# Patient Record
Sex: Male | Born: 1980
Health system: Southern US, Community
[De-identification: ages and names within clinical notes are randomized; demographics above are authoritative.]

## PROBLEM LIST (undated history)

## (undated) DIAGNOSIS — F329 Major depressive disorder, single episode, unspecified: Secondary | ICD-10-CM

## (undated) DIAGNOSIS — Z8781 Personal history of (healed) traumatic fracture: Secondary | ICD-10-CM

## (undated) DIAGNOSIS — K8020 Calculus of gallbladder without cholecystitis without obstruction: Secondary | ICD-10-CM

## (undated) DIAGNOSIS — F32A Depression, unspecified: Secondary | ICD-10-CM

## (undated) HISTORY — DX: Personal history of (healed) traumatic fracture: Z87.81

## (undated) HISTORY — DX: Depression, unspecified: F32.A

## (undated) HISTORY — DX: Calculus of gallbladder without cholecystitis without obstruction: K80.20

## (undated) HISTORY — PX: APPENDECTOMY: SHX54

## (undated) HISTORY — PX: REFRACTIVE SURGERY: SHX103

---
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
## 2008-07-21 DIAGNOSIS — Z8781 Personal history of (healed) traumatic fracture: Secondary | ICD-10-CM

## 2008-07-21 HISTORY — DX: Personal history of (healed) traumatic fracture: Z87.81

## 2008-07-21 HISTORY — PX: CHOLECYSTECTOMY: SHX55

## 2018-03-17 ENCOUNTER — Ambulatory Visit: Payer: 59 | Admitting: Family Medicine

## 2018-03-17 ENCOUNTER — Encounter: Payer: Self-pay | Admitting: Family Medicine

## 2018-03-17 ENCOUNTER — Other Ambulatory Visit: Payer: Self-pay

## 2018-03-17 VITALS — BP 128/84 | HR 65 | Temp 98.0°F | Wt 186.0 lb

## 2018-03-17 DIAGNOSIS — F4329 Adjustment disorder with other symptoms: Secondary | ICD-10-CM

## 2018-03-17 DIAGNOSIS — F321 Major depressive disorder, single episode, moderate: Secondary | ICD-10-CM | POA: Diagnosis not present

## 2018-03-17 HISTORY — DX: Adjustment disorder with other symptoms: F43.29

## 2018-03-17 MED ORDER — CITALOPRAM HYDROBROMIDE 10 MG PO TABS: 10.0000 mg | ORAL_TABLET | Freq: Every day | ORAL | 2 refills | Status: DC

## 2018-03-17 NOTE — Patient Instructions (Signed)
Living With Depression Everyone experiences occasional disappointment, sadness, and loss in their lives. When you are feeling down, blue, or sad for at least 2 weeks in a row, it may mean that you have depression. Depression can affect your thoughts and feelings, relationships, daily activities, and physical health. It is caused by changes in the way your brain functions. If you receive a diagnosis of depression, your health care provider will tell you which type of depression you have and what treatment options are available to you. If you are living with depression, there are ways to help you recover from it and also ways to prevent it from coming back. How to cope with lifestyle changes Coping with stress Stress is your body's reaction to life changes and events, both good and bad. Stressful situations may include:  Getting married.  The death of a spouse.  Losing a job.  Retiring.  Having a baby.  Stress can last just a few hours or it can be ongoing. Stress can play a major role in depression, so it is important to learn both how to cope with stress and how to think about it differently. Talk with your health care provider or a counselor if you would like to learn more about stress reduction. He or she may suggest some stress reduction techniques, such as:  Music therapy. This can include creating music or listening to music. Choose music that you enjoy and that inspires you.  Mindfulness-based meditation. This kind of meditation can be done while sitting or walking. It involves being aware of your normal breaths, rather than trying to control your breathing.  Centering prayer. This is a kind of meditation that involves focusing on a spiritual word or phrase. Choose a word, phrase, or sacred image that is meaningful to you and that brings you peace.  Deep breathing. To do this, expand your stomach and inhale slowly through your nose. Hold your breath for 3-5 seconds, then exhale  slowly, allowing your stomach muscles to relax.  Muscle relaxation. This involves intentionally tensing muscles then relaxing them.  Choose a stress reduction technique that fits your lifestyle and personality. Stress reduction techniques take time and practice to develop. Set aside 5-15 minutes a day to do them. Therapists can offer training in these techniques. The training may be covered by some insurance plans. Other things you can do to manage stress include:  Keeping a stress diary. This can help you learn what triggers your stress and ways to control your response.  Understanding what your limits are and saying no to requests or events that lead to a schedule that is too full.  Thinking about how you respond to certain situations. You may not be able to control everything, but you can control how you react.  Adding humor to your life by watching funny films or TV shows.  Making time for activities that help you relax and not feeling guilty about spending your time this way.  Medicines Your health care provider may suggest certain medicines if he or she feels that they will help improve your condition. Avoid using alcohol and other substances that may prevent your medicines from working properly (may interact). It is also important to:  Talk with your pharmacist or health care provider about all the medicines that you take, their possible side effects, and what medicines are safe to take together.  Make it your goal to take part in all treatment decisions (shared decision-making). This includes giving input on the side   effects of medicines. It is best if shared decision-making with your health care provider is part of your total treatment plan.  If your health care provider prescribes a medicine, you may not notice the full benefits of it for 4-8 weeks. Most people who are treated for depression need to be on medicine for at least 6-12 months after they feel better. If you are taking  medicines as part of your treatment, do not stop taking medicines without first talking to your health care provider. You may need to have the medicine slowly decreased (tapered) over time to decrease the risk of harmful side effects. Relationships Your health care provider may suggest family therapy along with individual therapy and drug therapy. While there may not be family problems that are causing you to feel depressed, it is still important to make sure your family learns as much as they can about your mental health. Having your family's support can help make your treatment successful. How to recognize changes in your condition Everyone has a different response to treatment for depression. Recovery from major depression happens when you have not had signs of major depression for two months. This may mean that you will start to:  Have more interest in doing activities.  Feel less hopeless than you did 2 months ago.  Have more energy.  Overeat less often, or have better or improving appetite.  Have better concentration.  Your health care provider will work with you to decide the next steps in your recovery. It is also important to recognize when your condition is getting worse. Watch for these signs:  Having fatigue or low energy.  Eating too much or too little.  Sleeping too much or too little.  Feeling restless, agitated, or hopeless.  Having trouble concentrating or making decisions.  Having unexplained physical complaints.  Feeling irritable, angry, or aggressive.  Get help as soon as you or your family members notice these symptoms coming back. How to get support and help from others How to talk with friends and family members about your condition Talking to friends and family members about your condition can provide you with one way to get support and guidance. Reach out to trusted friends or family members, explain your symptoms to them, and let them know that you are  working with a health care provider to treat your depression. Financial resources Not all insurance plans cover mental health care, so it is important to check with your insurance carrier. If paying for co-pays or counseling services is a problem, search for a local or county mental health care center. They may be able to offer public mental health care services at low or no cost when you are not able to see a private health care provider. If you are taking medicine for depression, you may be able to get the generic form, which may be less expensive. Some makers of prescription medicines also offer help to patients who cannot afford the medicines they need. Follow these instructions at home:  Get the right amount and quality of sleep.  Cut down on using caffeine, tobacco, alcohol, and other potentially harmful substances.  Try to exercise, such as walking or lifting small weights.  Take over-the-counter and prescription medicines only as told by your health care provider.  Eat a healthy diet that includes plenty of vegetables, fruits, whole grains, low-fat dairy products, and lean protein. Do not eat a lot of foods that are high in solid fats, added sugars, or salt.    Keep all follow-up visits as told by your health care provider. This is important. Contact a health care provider if:  You stop taking your antidepressant medicines, and you have any of these symptoms: ? Nausea. ? Headache. ? Feeling lightheaded. ? Chills and body aches. ? Not being able to sleep (insomnia).  You or your friends and family think your depression is getting worse. Get help right away if:  You have thoughts of hurting yourself or others. If you ever feel like you may hurt yourself or others, or have thoughts about taking your own life, get help right away. You can go to your nearest emergency department or call:  Your local emergency services (911 in the [place]  A suicide crisis helpline, such as the  National Suicide Prevention Lifeline at 1-800-273-8255. This is open 24-hours a day.  Summary  If you are living with depression, there are ways to help you recover from it and also ways to prevent it from coming back.  Work with your health care team to create a management plan that includes counseling, stress management techniques, and healthy lifestyle habits. This information is not intended to replace advice given to you by your health care provider. Make sure you discuss any questions you have with your health care provider. Document Released: 06/09/2016 Document Revised: 06/09/2016 Document Reviewed: 06/09/2016 Elsevier Interactive Patient Education  2018 [employer] Inc[employer]  

## 2018-03-17 NOTE — Progress Notes (Signed)
Subjective:    Patient ID: Brandon Murray, male    DOB: 1980-08-15, 37 y.o.   MRN: [id number]  HPI   Patient presents to clinic to establish care with primary care provider.  Past medical history, family history, surgical history, social history reviewed and updated in chart.  Patient's main concern is depression.  Patient has gone through a lot of stressful changes in his life recently.  He got married in May, but wife told him she wanted a divorce a few weeks after the wedding.  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  He went to his Investment banker, operational who told him to take off as much time as he needed, and helped him get set up with a counselor.  Patient feels the counseling sessions have been helpful, but is interested in starting a medication to better control depression symptoms.    Patient also is concerned with where he will live, states he and wife built a house in [place] which she is currently living in, but he moved out and had to get a new place quickly.  Patient has no children of his own, but did have 2 stepchildren with wife.  States he has known the children since they were very young around age 35, and they are currently elementary school age -not seeing the children has been a hard change.  Patient has been trying to keep himself busy with exercise and spending time with his parents.   Patient Active Problem List   Diagnosis Date Noted  . Depression, major, single episode, moderate ([place]) 03/17/2018  . Stress and adjustment reaction 03/17/2018   Social History   Tobacco Use  . Smoking status: Never Smoker  . Smokeless tobacco: Never Used  Substance Use Topics  . Alcohol use: Yes    Alcohol/week: 1.0 - 2.0 standard drinks    Types: 1 - 2 Cans of beer per week    Comment: drinks 1-2 beers  weekly   Family History  Problem Relation Age of Onset  . Diabetes Father   . Diabetes Maternal Grandmother   . Cancer Maternal Grandfather   . Diabetes Maternal Grandfather   . Heart disease Paternal Grandmother    Past Surgical History:  Procedure Laterality Date  . CHOLECYSTECTOMY  2010   Past Medical History:  Diagnosis Date  . History of fractured pelvis 2010    Review of Systems  Constitutional: Negative for chills, fatigue and fever.  HENT: Negative for congestion, ear pain, sinus pain and sore throat.   Eyes: Negative.   Respiratory: Negative for cough, shortness of breath and wheezing.   Cardiovascular: Negative for chest pain, palpitations and leg swelling.  Gastrointestinal: Negative for abdominal pain, diarrhea, nausea and vomiting.  Genitourinary: Negative for dysuria, frequency and urgency.  Musculoskeletal: Negative for arthralgias and myalgias.  Skin: Negative for color change, pallor and rash.  Neurological: Negative for syncope, light-headedness and headaches.  Psychiatric/Behavioral: Increased stress, anxiety, depression     Objective:   Physical Exam  Constitutional: He is oriented to person, place, and time. He appears well-developed and well-nourished.  HENT:  Head: Normocephalic and atraumatic.  Eyes: No scleral icterus.  Cardiovascular: Normal rate.  Pulmonary/Chest: Effort normal. No respiratory distress.  Neurological: He is alert and oriented to person, place, and time.  Gait normal  Skin: Skin is warm and dry. He is not diaphoretic. No pallor.  Psychiatric: His behavior is normal. Judgment and thought content normal.  Patient seems sad, down. Denies any SI - states he did have one moment with a thought of what would life be like if he was dead when he and wife first split up, but he went straight to his director and go set up with counseling.  Nursing note and vitals reviewed.     Vitals:   03/17/18 0822  BP: 128/84  Pulse: 65  Temp:  98 F (36.7 C)  SpO2: 98%   PHQ 9 score: 9  Assessment & Plan:    Depression - patient will begin Celexa 10 mg once daily.  He will continue counseling sessions, has been going every 1 to 2 weeks.  Also enjoys working out and will continue to do this to keep her mind busy.  Has been trying to spend time with family as well.   Increased stress - patient has had a lot of extreme changes in his life occur all at the same time.  He is taking some time from work to help manage all this, but plans to get back into work soon.  Different strategies discussed to help manage stress including exercise, reading inspirational quotes and books, listening to motivational speakers.   He will follow-up in approximately 3 weeks for recheck on mood after starting the new medication.

## 2018-04-07 ENCOUNTER — Ambulatory Visit: Payer: 59 | Admitting: Family Medicine

## 2018-04-07 DIAGNOSIS — Z0289 Encounter for other administrative examinations: Secondary | ICD-10-CM

## 2018-04-19 ENCOUNTER — Encounter: Payer: Self-pay | Admitting: Family Medicine

## 2018-04-19 ENCOUNTER — Ambulatory Visit: Payer: 59 | Admitting: Family Medicine

## 2018-04-19 DIAGNOSIS — F321 Major depressive disorder, single episode, moderate: Secondary | ICD-10-CM | POA: Diagnosis not present

## 2018-04-19 MED ORDER — CITALOPRAM HYDROBROMIDE 10 MG PO TABS: 10.0000 mg | ORAL_TABLET | Freq: Every day | ORAL | 1 refills | Status: DC

## 2018-04-19 NOTE — Progress Notes (Signed)
   Subjective:    Patient ID: Brandon Murray, male    DOB: 10-Oct-1980, 37 y.o.   MRN: [id number]  HPI   Patient presents to clinic to follow-up on anxiety after starting Celexa.  States his anxiety is very much improved.  He continues to see counseling at least 1 time every 1 to 2 weeks.  States he feels that the combination of medication and counseling has helped improve his anxiety greatly.  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  States that shifts are demanding, but overall feels he is handling it well.  Denies any suicidal ideation.  Patient Active Problem List   Diagnosis Date Noted  . Depression, major, single episode, moderate ([place]) 03/17/2018  . Stress and adjustment reaction 03/17/2018   Social History   Tobacco Use  . Smoking status: Never Smoker  . Smokeless tobacco: Never Used  Substance Use Topics  . Alcohol use: Yes    Alcohol/week: 1.0 - 2.0 standard drinks    Types: 1 - 2 Cans of beer per week    Comment: drinks 1-2 beers weekly    Review of Systems  Constitutional: Negative for chills, fatigue and fever.  HENT: Negative for congestion, ear pain, sinus pain and sore throat.   Eyes: Negative.   Respiratory: Negative for cough, shortness of breath and wheezing.   Cardiovascular: Negative for chest pain, palpitations and leg swelling.  Gastrointestinal: Negative for abdominal pain, diarrhea, nausea and vomiting.  Genitourinary: Negative for dysuria, frequency and urgency.  Musculoskeletal: Negative for arthralgias and myalgias.  Skin: Negative for color change, pallor and rash.  Neurological: Negative for syncope, light-headedness and headaches.  Psychiatric/Behavioral: The patient is not nervous/anxious.       Objective:   Physical Exam  Constitutional: He is oriented to person, place, and time. He appears well-developed and well-nourished. No distress.  HENT:  Head: Normocephalic and  atraumatic.  Cardiovascular: Normal rate.  Pulmonary/Chest: Effort normal. No respiratory distress.  Neurological: He is alert and oriented to person, place, and time.  Skin: Skin is warm and dry. No pallor.  Psychiatric: He has a normal mood and affect. His behavior is normal. Judgment and thought content normal.  No SI  Nursing note and vitals reviewed.     Vitals:   04/19/18 0842  BP: 122/82  Pulse: 60  Temp: 97.9 F (36.6 C)  SpO2: 97%    Assessment & Plan:    Anxiety - patient will continue Celexa 10 mg once daily.  He also plans to continue seeing his counselor at least once every 2 weeks as this is been very helpful to him.   Patient gets flu vaccine at employee clinic  Patient will follow-up in approximately 3 months for recheck on how he is doing.  Patient advised he can return to clinic sooner at any time if issues arise.

## 2018-07-06 DIAGNOSIS — H5213 Myopia, bilateral: Secondary | ICD-10-CM | POA: Diagnosis not present

## 2018-07-06 DIAGNOSIS — H52223 Regular astigmatism, bilateral: Secondary | ICD-10-CM | POA: Diagnosis not present

## 2018-07-22 ENCOUNTER — Ambulatory Visit: Payer: 59 | Admitting: Family Medicine

## 2018-07-22 ENCOUNTER — Encounter: Payer: Self-pay | Admitting: Family Medicine

## 2018-07-22 VITALS — BP 120/76 | HR 76 | Temp 98.4°F | Ht 69.0 in | Wt 198.0 lb

## 2018-07-22 DIAGNOSIS — R102 Pelvic and perineal pain: Secondary | ICD-10-CM

## 2018-07-22 DIAGNOSIS — F4329 Adjustment disorder with other symptoms: Secondary | ICD-10-CM | POA: Diagnosis not present

## 2018-07-22 DIAGNOSIS — F321 Major depressive disorder, single episode, moderate: Secondary | ICD-10-CM

## 2018-07-22 LAB — CBC WITH DIFFERENTIAL/PLATELET
BASOS PCT: 0.9 % (ref 0.0–3.0)
Basophils Absolute: 0.1 10*3/uL (ref 0.0–0.1)
EOS PCT: 1.7 % (ref 0.0–5.0)
Eosinophils Absolute: 0.1 10*3/uL (ref 0.0–0.7)
HCT: 45.1 % (ref 39.0–52.0)
HEMOGLOBIN: 15.2 g/dL (ref 13.0–17.0)
Lymphocytes Relative: 23.1 % (ref 12.0–46.0)
Lymphs Abs: 1.8 10*3/uL (ref 0.7–4.0)
MCHC: 33.7 g/dL (ref 30.0–36.0)
MCV: 84.3 fl (ref 78.0–100.0)
MONO ABS: 0.7 10*3/uL (ref 0.1–1.0)
MONOS PCT: 9.6 % (ref 3.0–12.0)
NEUTROS PCT: 64.7 % (ref 43.0–77.0)
Neutro Abs: 5 10*3/uL (ref 1.4–7.7)
Platelets: 325 10*3/uL (ref 150.0–400.0)
RBC: 5.35 Mil/uL (ref 4.22–5.81)
RDW: 12.9 % (ref 11.5–15.5)
WBC: 7.8 10*3/uL (ref 4.0–10.5)

## 2018-07-22 LAB — URINALYSIS, ROUTINE W REFLEX MICROSCOPIC
BILIRUBIN URINE: NEGATIVE
Ketones, ur: NEGATIVE
LEUKOCYTES UA: NEGATIVE
Nitrite: NEGATIVE
PH: 6 (ref 5.0–8.0)
SPECIFIC GRAVITY, URINE: 1.015 (ref 1.000–1.030)
TOTAL PROTEIN, URINE-UPE24: NEGATIVE
Urine Glucose: NEGATIVE
Urobilinogen, UA: 0.2 (ref 0.0–1.0)

## 2018-07-22 LAB — BASIC METABOLIC PANEL
BUN: 10 mg/dL (ref 6–23)
CALCIUM: 9.3 mg/dL (ref 8.4–10.5)
CO2: 26 meq/L (ref 19–32)
CREATININE: 1.11 mg/dL (ref 0.40–1.50)
Chloride: 101 mEq/L (ref 96–112)
GFR: 79.82 mL/min (ref >60.00)
Glucose, Bld: 75 mg/dL (ref 70–99)
Potassium: 4.1 mEq/L (ref 3.5–5.1)
SODIUM: 135 meq/L (ref 135–145)

## 2018-07-22 NOTE — Progress Notes (Signed)
Subjective:    Patient ID: Brandon Murray, male    DOB: [birth year], 38 y.o.   MRN: [id number]  HPI   Presents to clinic for 3 month follow up on mood with being on celexa.  Overall he is feeling well. He spent the holidays with his parents.  States he was at first worried that the holidays would be tough due to he is no longer with his wife and stepchildren, but he actually enjoyed himself over the Christmas holiday.  States he still thinks about his ex-wife and stepchildren, but he no longer feels an overwhelming sadness.  Patient states he is able to function well and does not feel his emotions are running his life.  He also enjoys his work very much, feels it keeps him occupied and he feels he has a purpose because he is able to help others.  Denies any SI or HI. Sees counselor every 2-4 weeks.   Patient also does report a suprapubic tenderness, notices it off and on.  Denies any pain with urination, denies any possibility of STD.  Bowel movements are normal.  States he did increase his caffeine intake over the past month due to having to work the night shift.  No nausea or vomiting.  Appetite is normal, tries to keep himself hydrated.  States the pain improves after he empties his bladder.  Patient Active Problem List   Diagnosis Date Noted  . Depression, major, single episode, moderate ([place]) 03/17/2018  . Stress and adjustment reaction 03/17/2018   Social History   Tobacco Use  . Smoking status: Never Smoker  . Smokeless tobacco: Never Used  Substance Use Topics  . Alcohol use: Yes    Alcohol/week: 1.0 - 2.0 standard drinks    Types: 1 - 2 Cans of beer per week    Comment: drinks 1-2 beers weekly   Review of Systems   Constitutional: Negative for chills, fatigue and fever.  HENT: Negative for congestion, ear pain, sinus pain and sore throat.   Eyes: Negative.   Respiratory: Negative for cough, shortness of breath and wheezing.   Cardiovascular: Negative for chest pain,  palpitations and leg swelling.  Gastrointestinal: Negative for abdominal pain, diarrhea, nausea and vomiting.  Genitourinary: Negative for dysuria, frequency and urgency. Some suprapubic pain, better after empties bladder Musculoskeletal: Negative for arthralgias and myalgias.  Skin: Negative for color change, pallor and rash.  Neurological: Negative for syncope, light-headedness and headaches.  Psychiatric/Behavioral: The patient is not nervous/anxious.       Objective:   Physical Exam   Constitutional: He appears well-developed and well-nourished. No distress.  HENT:  Head: Normocephalic and atraumatic.  Eyes: Pupils are equal, round, and reactive to light. Conjunctivae and EOM are normal. No scleral icterus.  Neck: Normal range of motion. Neck supple. No tracheal deviation present.  Cardiovascular: Normal rate, regular rhythm and normal heart sounds.  Pulmonary/Chest: Effort normal and breath sounds normal. No respiratory distress. He has no wheezes. He has no rales.  Abdominal: Soft. Bowel sounds are normal.  Positive mild left-sided suprapubic tenderness. Neurological: He is alert and oriented to person, place, and time.  Gait normal  Skin: Skin is warm and dry. He is not diaphoretic. No pallor.  Psychiatric: He has a normal mood and affect. His behavior is normal. Thought content normal.   Nursing note and vitals reviewed.  Vitals:   07/22/18 0816  BP: 120/76  Pulse: 76  Temp: 98.4 F (36.9 C)  SpO2: 95%  Assessment & Plan:   Depression, stress and adjustment reaction - patient's mood is stable on Celexa 10 mg.  He will continue this medication.  He has good support from his family and enjoys his work very much.  He will continue to utilize counselor as he has been doing.  Suprapubic pain - tenderness is mild.  We will check urine for any UTI, and we will also get CBC and BMP in clinic today.  Patient encouraged to avoid excess caffeine and sugary beverages and try  to increase water intake.  If pain persists and urine is unremarkable for any abnormality, we can consider ultrasound imaging at that time.  Patient will follow-up here in approximately 3 months for recheck on mood, he is aware he return to clinic sooner if any issues arise.

## 2018-07-23 LAB — URINE CULTURE
MICRO NUMBER: 2930
Result:: NO GROWTH
SPECIMEN QUALITY:: ADEQUATE

## 2018-07-26 ENCOUNTER — Telehealth: Payer: Self-pay | Admitting: Family Medicine

## 2018-07-26 NOTE — Telephone Encounter (Signed)
Copied from [place] [id number]. Topic: Quick Communication - Lab Results (Clinic Use ONLY) >> Jul 26, 2018  9:55 AM Neta Ehlers, RMA wrote: Called patient to inform them of 07/23/2018 lab results. When patient returns call, triage nurse may disclose results. >> Jul 26, 2018  4:17 PM Bea Graff, NT wrote: Pt returning call to receive lab results. May leave detailed message per pt.

## 2018-08-24 ENCOUNTER — Telehealth: Payer: Self-pay | Admitting: Radiology

## 2018-08-24 DIAGNOSIS — R319 Hematuria, unspecified: Secondary | ICD-10-CM

## 2018-08-24 NOTE — Telephone Encounter (Signed)
Urine lab order in

## 2018-08-24 NOTE — Telephone Encounter (Signed)
Pt coming in for labs tomorrow, please place future orders. Thank you.  

## 2018-08-25 ENCOUNTER — Other Ambulatory Visit: Payer: 59

## 2018-10-08 MED FILL — CITALOPRAM HBR 10 MG TABLET: 10 | 90 days supply | Qty: 90 | Fill #0

## 2018-10-11 ENCOUNTER — Ambulatory Visit (INDEPENDENT_AMBULATORY_CARE_PROVIDER_SITE_OTHER): Payer: 59 | Admitting: Family Medicine

## 2018-10-11 ENCOUNTER — Other Ambulatory Visit: Payer: Self-pay

## 2018-10-11 ENCOUNTER — Encounter: Payer: Self-pay | Admitting: Family Medicine

## 2018-10-11 DIAGNOSIS — R319 Hematuria, unspecified: Secondary | ICD-10-CM | POA: Diagnosis not present

## 2018-10-11 DIAGNOSIS — L649 Androgenic alopecia, unspecified: Secondary | ICD-10-CM | POA: Diagnosis not present

## 2018-10-11 DIAGNOSIS — F321 Major depressive disorder, single episode, moderate: Secondary | ICD-10-CM | POA: Diagnosis not present

## 2018-10-11 LAB — POCT URINALYSIS DIPSTICK
BILIRUBIN UA: NEGATIVE
Glucose, UA: NEGATIVE
Ketones, UA: NEGATIVE
LEUKOCYTES UA: NEGATIVE
Nitrite, UA: NEGATIVE
PH UA: 6.5 (ref 5.0–8.0)
Protein, UA: NEGATIVE
RBC UA: NEGATIVE
Spec Grav, UA: 1.02 (ref 1.010–1.025)
UROBILINOGEN UA: 0.2 U/dL

## 2018-10-11 LAB — URINALYSIS, ROUTINE W REFLEX MICROSCOPIC
Bilirubin Urine: NEGATIVE
Ketones, ur: NEGATIVE
Leukocytes,Ua: NEGATIVE
NITRITE: NEGATIVE
SPECIFIC GRAVITY, URINE: 1.02 (ref 1.000–1.030)
Total Protein, Urine: NEGATIVE
Urine Glucose: NEGATIVE
Urobilinogen, UA: 0.2 (ref 0.0–1.0)
pH: 7 (ref 5.0–8.0)

## 2018-10-11 MED ORDER — FINASTERIDE 1 MG PO TABS: 1.0000 mg | ORAL_TABLET | Freq: Every day | ORAL | 1 refills | Status: DC

## 2018-10-11 MED ORDER — CITALOPRAM HYDROBROMIDE 10 MG PO TABS: 10.0000 mg | ORAL_TABLET | Freq: Every day | ORAL | 1 refills | Status: DC

## 2018-10-11 NOTE — Progress Notes (Signed)
Subjective:    Patient ID: Brandon Murray, male    DOB: [birth year], 38 y.o.   MRN: [id number]  HPI   Patient presents to clinic for follow-up on suprapubic pain hematuria, anxiety and depression, and also concerns over male pattern baldness.  Patient states he has had no more suprapubic pain, it happened that one occasion has never returned.  This not seen any more blood in his urine.  Mood is well controlled on Celexa.  States he is actually enjoying life and work, and is pleased with the results of this medication.  No SI or HI.  Patient states he is interested in trying a medication to help improve male pattern baldness.  His maternal grandfather and also his father both have male pattern baldness, so he is wondering if the medication may help him.  He has tried using topical Rogaine a few times, but has not been enough time to see any effect.  Patient Active Problem List   Diagnosis Date Noted  . 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 Depression, major, single episode, moderate ([place]) 03/17/2018  . Stress and adjustment reaction 03/17/2018   Social History   Tobacco Use  . Smoking status: Never Smoker  . Smokeless tobacco: Never Used  Substance Use Topics  . Alcohol use: Yes    Alcohol/week: 1.0 - 2.0 standard drinks    Types: 1 - 2 Cans of beer per week    Comment: drinks 1-2 beers weekly    Review of Systems   Constitutional: Negative for chills, fatigue and fever.  HENT: Negative for congestion, ear pain, sinus pain and sore throat.   Eyes: Negative.   Respiratory: Negative for cough, shortness of breath and wheezing.   Cardiovascular: Negative for chest pain, palpitations and leg swelling.  Gastrointestinal: Negative for abdominal pain, diarrhea, nausea and vomiting.  Genitourinary: Negative for dysuria, frequency and urgency.  Musculoskeletal: Negative for arthralgias and myalgias.  Skin: Negative for color change, pallor and rash. +hair loss Neurological: Negative  for syncope, light-headedness and headaches.  Psychiatric/Behavioral: The patient is not nervous/anxious.       Objective:   Physical Exam  Constitutional: He appears well-developed and well-nourished. No distress.  HENT:  Head: Normocephalic and atraumatic.  Eyes: Conjunctivae and EOM are normal. No scleral icterus.  Neck: Normal range of motion. Neck supple. No tracheal deviation present.  Cardiovascular: Normal rate, regular rhythm and normal heart sounds.  Pulmonary/Chest: Effort normal and breath sounds normal. No respiratory distress. He has no wheezes. He has no rales.  Abdominal: Soft. There is no tenderness.  Neurological: He is alert and oriented to person, place, and time.  Gait normal  Skin: Skin is warm and dry. He is not diaphoretic. No pallor. +male pattern baldness Psychiatric: He has a normal mood and affect. His behavior is normal. Thought content normal.  Nursing note and vitals reviewed.    Vitals:   10/11/18 0854  BP: 116/82  Pulse: (!) 56  Resp: 18  Temp: 97.7 F (36.5 C)  SpO2: 97%      Assessment & Plan:   Hematuria/suprapubic pain-we will recheck urine in clinic to see if hematuria has resolved.  Pubic pain has not happened again since one incident.  Anxiety depression- mood is well controlled on Celexa, will continue.  Refill sent in.  Male pattern baldness-patient will begin finasteride 1 mg daily to see if this helps improve male pattern baldness.  Patient will return to clinic in approximately 6 months for recheck  on chronic medical conditions.  Advised to return to clinic sooner if any issues arise.

## 2018-10-21 ENCOUNTER — Ambulatory Visit: Payer: 59 | Admitting: Family Medicine

## 2019-04-11 ENCOUNTER — Other Ambulatory Visit: Payer: Self-pay

## 2019-04-13 ENCOUNTER — Encounter: Payer: Self-pay | Admitting: Family Medicine

## 2019-04-13 ENCOUNTER — Other Ambulatory Visit: Payer: Self-pay

## 2019-04-13 ENCOUNTER — Ambulatory Visit: Payer: 59 | Admitting: Family Medicine

## 2019-04-13 VITALS — BP 102/68 | HR 83 | Temp 96.5°F | Resp 16 | Ht 69.0 in | Wt 203.0 lb

## 2019-04-13 DIAGNOSIS — F321 Major depressive disorder, single episode, moderate: Secondary | ICD-10-CM

## 2019-04-13 NOTE — Progress Notes (Signed)
Subjective:    Patient ID: Brandon Murray, male    DOB: [birth year], 38 y.o.   MRN: [id number]  HPI   Patient presents to clinic for follow-up on depressive disorder.  Feels good on Celexa.  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  Feels he has more free time and more time in his schedule to do things that he likes to do.  Recently took a motorcycle class and purchased a motorcycle, looking forward to going on trips with his motorcycle.  Notices himself not being is anxious or nervous about things like he was in the past and this makes him happy.  Denies feelings of severe sadness or depression.  Feels his mood is at a normal level.  Denies SI or HI.  Patient Active Problem List   Diagnosis Date Noted  . 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 Depression, major, single episode, moderate ([place]) 03/17/2018  . Stress and adjustment reaction 03/17/2018   Social History   Tobacco Use  . Smoking status: Never Smoker  . Smokeless tobacco: Never Used  Substance Use Topics  . Alcohol use: Yes    Alcohol/week: 1.0 - 2.0 standard drinks    Types: 1 - 2 Cans of beer per week    Comment: drinks 1-2 beers weekly   Review of Systems  Constitutional: Negative for chills, fatigue and fever.  HENT: Negative for congestion, ear pain, sinus pain and sore throat.   Eyes: Negative.   Respiratory: Negative for cough, shortness of breath and wheezing.   Cardiovascular: Negative for chest pain, palpitations and leg swelling.  Gastrointestinal: Negative for abdominal pain, diarrhea, nausea and vomiting.  Genitourinary: Negative for dysuria, frequency and urgency.  Musculoskeletal: Negative for arthralgias and myalgias.  Skin: Negative for color change, pallor and rash.  Neurological: Negative for syncope, light-headedness and headaches.  Psychiatric/Behavioral: The patient is not nervous/anxious.       Objective:   Physical Exam Vitals signs and nursing note reviewed.  Constitutional:      General: He is not in acute distress. Eyes:     General: No scleral icterus.    Extraocular Movements: Extraocular movements intact.     Conjunctiva/sclera: Conjunctivae normal.     Pupils: Pupils are equal, round, and reactive to light.  Cardiovascular:     Rate and Rhythm: Normal rate and regular rhythm.  Pulmonary:     Effort: Pulmonary effort is normal. No respiratory distress.     Breath sounds: Normal breath sounds.  Skin:    General: Skin is warm and dry.     Coloration: Skin is not jaundiced or pale.  Neurological:     General: No focal deficit present.     Mental Status: He is alert and oriented to person, place, and time.     Gait: Gait normal.  Psychiatric:        Mood and Affect: Mood normal.        Behavior: Behavior normal.        Thought Content: Thought content normal.        Judgment: Judgment normal.    Today's Vitals   04/13/19 0809  BP: 102/68  Pulse: 83  Resp: 16  Temp: (!) 96.5 F (35.8 C)  TempSrc: Temporal  SpO2: 98%  Weight: 203 lb (92.1 kg)  Height: 5\' 9"  (1.753 m)   Body mass index is 29.98 kg/m.     Assessment & Plan:    Depressive disorder-patient will continue Celexa 10 mg/day.  Feels good on this dose.  Has good support from family and friends, finding different hobbies and really beginning to enjoy life rather than having a lot of stress all the time.  Patient will follow-up 6 months for CPE.  He will return to clinic sooner if any issues arise.  He will get flu vaccine through her employer.

## 2019-04-26 ENCOUNTER — Other Ambulatory Visit: Payer: Self-pay | Admitting: Family Medicine

## 2019-04-26 DIAGNOSIS — L649 Androgenic alopecia, unspecified: Secondary | ICD-10-CM

## 2019-07-08 ENCOUNTER — Other Ambulatory Visit: Payer: Self-pay | Admitting: *Deleted

## 2019-07-08 DIAGNOSIS — F321 Major depressive disorder, single episode, moderate: Secondary | ICD-10-CM

## 2019-07-08 NOTE — Telephone Encounter (Signed)
Citalopram 

## 2019-07-08 NOTE — Telephone Encounter (Signed)
Okay to refill? Former [facility] patient

## 2019-07-08 NOTE — Telephone Encounter (Signed)
What are we refilling? There is not a medication attached.

## 2019-07-09 MED ORDER — CITALOPRAM HYDROBROMIDE 10 MG PO TABS: 10.0000 mg | ORAL_TABLET | Freq: Every day | ORAL | 1 refills | Status: DC

## 2019-10-13 ENCOUNTER — Other Ambulatory Visit: Payer: Self-pay | Admitting: Family Medicine

## 2019-10-13 DIAGNOSIS — L649 Androgenic alopecia, unspecified: Secondary | ICD-10-CM

## 2019-12-05 ENCOUNTER — Other Ambulatory Visit: Payer: Self-pay

## 2019-12-05 ENCOUNTER — Observation Stay (HOSPITAL_COMMUNITY)
Admission: EM | Admit: 2019-12-05 | Discharge: 2019-12-06 | Disposition: A | Discharge status: Home / Self Care | Payer: 59 | Attending: General Surgery | Admitting: General Surgery

## 2019-12-05 ENCOUNTER — Encounter (HOSPITAL_COMMUNITY)
Admission: EM | Disposition: A | Discharge status: Home / Self Care | Payer: Self-pay | Source: Home / Self Care | Attending: Emergency Medicine

## 2019-12-05 ENCOUNTER — Encounter (HOSPITAL_COMMUNITY): Payer: Self-pay | Admitting: Emergency Medicine

## 2019-12-05 ENCOUNTER — Emergency Department (HOSPITAL_COMMUNITY): Payer: 59

## 2019-12-05 ENCOUNTER — Observation Stay (HOSPITAL_COMMUNITY): Payer: 59 | Admitting: Anesthesiology

## 2019-12-05 DIAGNOSIS — R1031 Right lower quadrant pain: Secondary | ICD-10-CM | POA: Diagnosis not present

## 2019-12-05 DIAGNOSIS — Z20822 Contact with and (suspected) exposure to covid-19: Secondary | ICD-10-CM | POA: Diagnosis not present

## 2019-12-05 DIAGNOSIS — L649 Androgenic alopecia, unspecified: Secondary | ICD-10-CM | POA: Diagnosis not present

## 2019-12-05 DIAGNOSIS — R9431 Abnormal electrocardiogram [ECG] [EKG]: Secondary | ICD-10-CM | POA: Diagnosis not present

## 2019-12-05 DIAGNOSIS — F329 Major depressive disorder, single episode, unspecified: Secondary | ICD-10-CM | POA: Diagnosis not present

## 2019-12-05 DIAGNOSIS — Z79899 Other long term (current) drug therapy: Secondary | ICD-10-CM | POA: Insufficient documentation

## 2019-12-05 DIAGNOSIS — K3580 Unspecified acute appendicitis: Secondary | ICD-10-CM | POA: Diagnosis present

## 2019-12-05 DIAGNOSIS — K3532 Acute appendicitis with perforation and localized peritonitis, without abscess: Secondary | ICD-10-CM | POA: Diagnosis not present

## 2019-12-05 DIAGNOSIS — Z03818 Encounter for observation for suspected exposure to other biological agents ruled out: Secondary | ICD-10-CM | POA: Diagnosis not present

## 2019-12-05 DIAGNOSIS — K353 Acute appendicitis with localized peritonitis, without perforation or gangrene: Principal | ICD-10-CM | POA: Insufficient documentation

## 2019-12-05 DIAGNOSIS — R1084 Generalized abdominal pain: Secondary | ICD-10-CM | POA: Diagnosis present

## 2019-12-05 HISTORY — DX: Unspecified acute appendicitis: K35.80

## 2019-12-05 HISTORY — PX: LAPAROSCOPIC APPENDECTOMY: SHX408

## 2019-12-05 LAB — URINALYSIS, ROUTINE W REFLEX MICROSCOPIC
Bilirubin Urine: NEGATIVE
Glucose, UA: NEGATIVE mg/dL
Ketones, ur: 80 mg/dL — AB
Leukocytes,Ua: NEGATIVE
Nitrite: NEGATIVE
Protein, ur: NEGATIVE mg/dL
Specific Gravity, Urine: 1.014 (ref 1.005–1.030)
pH: 6 (ref 5.0–8.0)

## 2019-12-05 LAB — CBC
HCT: 44 % (ref 39.0–52.0)
Hemoglobin: 14.7 g/dL (ref 13.0–17.0)
MCH: 28.2 pg (ref 26.0–34.0)
MCHC: 33.4 g/dL (ref 30.0–36.0)
MCV: 84.3 fL (ref 80.0–100.0)
Platelets: 374 10*3/uL (ref 150–400)
RBC: 5.22 MIL/uL (ref 4.22–5.81)
RDW: 12.4 % (ref 11.5–15.5)
WBC: 18.7 10*3/uL — ABNORMAL HIGH (ref 4.0–10.5)
nRBC: 0 % (ref 0.0–0.2)

## 2019-12-05 LAB — COMPREHENSIVE METABOLIC PANEL
ALT: 18 U/L (ref 0–44)
AST: 16 U/L (ref 15–41)
Albumin: 4 g/dL (ref 3.5–5.0)
Alkaline Phosphatase: 81 U/L (ref 38–126)
Anion gap: 12 (ref 5–15)
BUN: 6 mg/dL (ref 6–20)
CO2: 22 mmol/L (ref 22–32)
Calcium: 9.2 mg/dL (ref 8.9–10.3)
Chloride: 102 mmol/L (ref 98–111)
Creatinine, Ser: 1.16 mg/dL (ref 0.61–1.24)
GFR calc Af Amer: >60 mL/min (ref >60)
GFR calc non Af Amer: >60 mL/min (ref >60)
Glucose, Bld: 127 mg/dL — ABNORMAL HIGH (ref 70–99)
Potassium: 4 mmol/L (ref 3.5–5.1)
Sodium: 136 mmol/L (ref 135–145)
Total Bilirubin: 1.5 mg/dL — ABNORMAL HIGH (ref 0.3–1.2)
Total Protein: 7.3 g/dL (ref 6.5–8.1)

## 2019-12-05 LAB — SARS CORONAVIRUS 2 BY RT PCR (HOSPITAL ORDER, PERFORMED IN ~~LOC~~ HOSPITAL LAB): SARS Coronavirus 2: NEGATIVE

## 2019-12-05 LAB — LIPASE, BLOOD: Lipase: 20 U/L (ref 11–51)

## 2019-12-05 SURGERY — APPENDECTOMY, LAPAROSCOPIC
Anesthesia: General | Site: Abdomen

## 2019-12-05 MED ORDER — KETOROLAC TROMETHAMINE 15 MG/ML IJ SOLN
INTRAMUSCULAR | Status: AC
  Administered 2019-12-05: 15 mg via INTRAVENOUS
  Filled 2019-12-05: qty 1

## 2019-12-05 MED ORDER — ENOXAPARIN SODIUM 40 MG/0.4ML ~~LOC~~ SOLN: 40.0000 mg | SUBCUTANEOUS | Status: DC

## 2019-12-05 MED ORDER — DOCUSATE SODIUM 100 MG PO CAPS
100.0000 mg | ORAL_CAPSULE | Freq: Two times a day (BID) | ORAL | Status: DC
  Administered 2019-12-05: 100 mg via ORAL
  Filled 2019-12-05 (×2): qty 1

## 2019-12-05 MED ORDER — ONDANSETRON HCL 4 MG/2ML IJ SOLN
INTRAMUSCULAR | Status: AC
  Filled 2019-12-05: qty 8

## 2019-12-05 MED ORDER — LACTATED RINGERS IV SOLN: INTRAVENOUS | Status: DC

## 2019-12-05 MED ORDER — METOPROLOL TARTRATE 5 MG/5ML IV SOLN: 5.0000 mg | Freq: Four times a day (QID) | INTRAVENOUS | Status: DC | PRN

## 2019-12-05 MED ORDER — SODIUM CHLORIDE 0.9 % IV SOLN
2.0000 g | INTRAVENOUS | Status: DC
  Filled 2019-12-05: qty 20

## 2019-12-05 MED ORDER — KCL IN DEXTROSE-NACL 20-5-0.45 MEQ/L-%-% IV SOLN
INTRAVENOUS | Status: DC
  Filled 2019-12-05 (×3): qty 1000

## 2019-12-05 MED ORDER — PROPOFOL 10 MG/ML IV BOLUS
INTRAVENOUS | Status: AC
  Filled 2019-12-05: qty 40

## 2019-12-05 MED ORDER — BACITRACIN ZINC 500 UNIT/GM EX OINT
TOPICAL_OINTMENT | CUTANEOUS | Status: AC
  Filled 2019-12-05: qty 28.35

## 2019-12-05 MED ORDER — IOHEXOL 300 MG/ML  SOLN
75.0000 mL | Freq: Once | INTRAMUSCULAR | Status: AC | PRN
  Administered 2019-12-05: 100 mL via INTRAVENOUS

## 2019-12-05 MED ORDER — SUCCINYLCHOLINE CHLORIDE 200 MG/10ML IV SOSY
PREFILLED_SYRINGE | INTRAVENOUS | Status: AC
  Filled 2019-12-05: qty 20

## 2019-12-05 MED ORDER — FINASTERIDE 1 MG PO TABS: 1.0000 mg | ORAL_TABLET | Freq: Every day | ORAL | Status: DC

## 2019-12-05 MED ORDER — LIDOCAINE 2% (20 MG/ML) 5 ML SYRINGE
INTRAMUSCULAR | Status: AC
  Filled 2019-12-05: qty 15

## 2019-12-05 MED ORDER — PHENYLEPHRINE 40 MCG/ML (10ML) SYRINGE FOR IV PUSH (FOR BLOOD PRESSURE SUPPORT)
PREFILLED_SYRINGE | INTRAVENOUS | Status: AC
  Filled 2019-12-05: qty 40

## 2019-12-05 MED ORDER — SUCCINYLCHOLINE CHLORIDE 20 MG/ML IJ SOLN
INTRAMUSCULAR | Status: DC | PRN
  Administered 2019-12-05: 90 mg via INTRAVENOUS

## 2019-12-05 MED ORDER — SODIUM CHLORIDE 0.9 % IR SOLN
Status: DC | PRN
  Administered 2019-12-05: 1000 mL

## 2019-12-05 MED ORDER — MIDAZOLAM HCL 5 MG/5ML IJ SOLN
INTRAMUSCULAR | Status: DC | PRN
  Administered 2019-12-05: 2 mg via INTRAVENOUS

## 2019-12-05 MED ORDER — PHENYLEPHRINE 40 MCG/ML (10ML) SYRINGE FOR IV PUSH (FOR BLOOD PRESSURE SUPPORT)
PREFILLED_SYRINGE | INTRAVENOUS | Status: DC | PRN
  Administered 2019-12-05 (×2): 80 ug via INTRAVENOUS

## 2019-12-05 MED ORDER — MORPHINE SULFATE (PF) 2 MG/ML IV SOLN
2.0000 mg | INTRAVENOUS | Status: DC | PRN
  Administered 2019-12-05 – 2019-12-06 (×2): 2 mg via INTRAVENOUS
  Filled 2019-12-05 (×2): qty 1

## 2019-12-05 MED ORDER — DIPHENHYDRAMINE HCL 50 MG/ML IJ SOLN: 12.5000 mg | Freq: Four times a day (QID) | INTRAMUSCULAR | Status: DC | PRN

## 2019-12-05 MED ORDER — MORPHINE SULFATE (PF) 4 MG/ML IV SOLN
4.0000 mg | Freq: Once | INTRAVENOUS | Status: AC
  Administered 2019-12-05: 2 mg via INTRAVENOUS
  Filled 2019-12-05: qty 1

## 2019-12-05 MED ORDER — SODIUM CHLORIDE 0.9 % IV SOLN
2.0000 g | Freq: Once | INTRAVENOUS | Status: AC
  Administered 2019-12-05: 2 g via INTRAVENOUS
  Filled 2019-12-05: qty 20

## 2019-12-05 MED ORDER — ENOXAPARIN SODIUM 40 MG/0.4ML ~~LOC~~ SOLN
40.0000 mg | SUBCUTANEOUS | Status: DC
  Filled 2019-12-05: qty 0.4

## 2019-12-05 MED ORDER — SODIUM CHLORIDE 0.9 % IV BOLUS
1000.0000 mL | Freq: Once | INTRAVENOUS | Status: AC
  Administered 2019-12-05: 1000 mL via INTRAVENOUS

## 2019-12-05 MED ORDER — FINASTERIDE 1 MG PO TABS
1.0000 mg | ORAL_TABLET | Freq: Every day | ORAL | Status: DC
  Filled 2019-12-05 (×2): qty 1

## 2019-12-05 MED ORDER — PROPOFOL 10 MG/ML IV BOLUS
INTRAVENOUS | Status: DC | PRN
  Administered 2019-12-05: 40 mg via INTRAVENOUS
  Administered 2019-12-05: 200 mg via INTRAVENOUS

## 2019-12-05 MED ORDER — MORPHINE SULFATE (PF) 2 MG/ML IV SOLN
2.0000 mg | Freq: Once | INTRAVENOUS | Status: AC
  Administered 2019-12-05: 2 mg via INTRAVENOUS
  Filled 2019-12-05: qty 1

## 2019-12-05 MED ORDER — CITALOPRAM HYDROBROMIDE 20 MG PO TABS
10.0000 mg | ORAL_TABLET | Freq: Every day | ORAL | Status: DC
  Administered 2019-12-05: 10 mg via ORAL
  Filled 2019-12-05 (×2): qty 1

## 2019-12-05 MED ORDER — KETOROLAC TROMETHAMINE 15 MG/ML IJ SOLN: 15.0000 mg | INTRAMUSCULAR | Status: AC

## 2019-12-05 MED ORDER — ROCURONIUM BROMIDE 10 MG/ML (PF) SYRINGE
PREFILLED_SYRINGE | INTRAVENOUS | Status: AC
  Filled 2019-12-05: qty 40

## 2019-12-05 MED ORDER — EPHEDRINE 5 MG/ML INJ
INTRAVENOUS | Status: AC
  Filled 2019-12-05: qty 30

## 2019-12-05 MED ORDER — FENTANYL CITRATE (PF) 250 MCG/5ML IJ SOLN
INTRAMUSCULAR | Status: AC
  Filled 2019-12-05: qty 5

## 2019-12-05 MED ORDER — MIDAZOLAM HCL 2 MG/2ML IJ SOLN
INTRAMUSCULAR | Status: AC
  Filled 2019-12-05: qty 2

## 2019-12-05 MED ORDER — DEXMEDETOMIDINE HCL IN NACL 400 MCG/100ML IV SOLN
INTRAVENOUS | Status: DC | PRN
Start: 2019-12-05 — End: 2019-12-05
  Administered 2019-12-05 (×2): 4 ug via INTRAVENOUS
  Administered 2019-12-05: 8 ug via INTRAVENOUS

## 2019-12-05 MED ORDER — ACETAMINOPHEN 500 MG PO TABS
1000.0000 mg | ORAL_TABLET | Freq: Four times a day (QID) | ORAL | Status: DC
  Administered 2019-12-06 (×2): 1000 mg via ORAL
  Filled 2019-12-05 (×3): qty 2

## 2019-12-05 MED ORDER — DEXAMETHASONE SODIUM PHOSPHATE 10 MG/ML IJ SOLN
INTRAMUSCULAR | Status: AC
  Filled 2019-12-05: qty 4

## 2019-12-05 MED ORDER — ACETAMINOPHEN 500 MG PO TABS
1000.0000 mg | ORAL_TABLET | ORAL | Status: AC
  Administered 2019-12-05: 1000 mg via ORAL

## 2019-12-05 MED ORDER — DIPHENHYDRAMINE HCL 12.5 MG/5ML PO ELIX: 12.5000 mg | ORAL_SOLUTION | Freq: Four times a day (QID) | ORAL | Status: DC | PRN

## 2019-12-05 MED ORDER — 0.9 % SODIUM CHLORIDE (POUR BTL) OPTIME
TOPICAL | Status: DC | PRN
  Administered 2019-12-05: 1000 mL

## 2019-12-05 MED ORDER — SODIUM CHLORIDE 0.9% FLUSH
3.0000 mL | Freq: Once | INTRAVENOUS | Status: AC
  Administered 2019-12-05: 3 mL via INTRAVENOUS

## 2019-12-05 MED ORDER — GABAPENTIN 300 MG PO CAPS
ORAL_CAPSULE | ORAL | Status: AC
  Administered 2019-12-05: 300 mg via ORAL
  Filled 2019-12-05: qty 1

## 2019-12-05 MED ORDER — ROCURONIUM BROMIDE 100 MG/10ML IV SOLN
INTRAVENOUS | Status: DC | PRN
  Administered 2019-12-05: 40 mg via INTRAVENOUS
  Administered 2019-12-05: 5 mg via INTRAVENOUS
  Administered 2019-12-05: 10 mg via INTRAVENOUS

## 2019-12-05 MED ORDER — METRONIDAZOLE IN NACL 5-0.79 MG/ML-% IV SOLN
500.0000 mg | Freq: Once | INTRAVENOUS | Status: AC
  Administered 2019-12-05: 500 mg via INTRAVENOUS
  Filled 2019-12-05: qty 100

## 2019-12-05 MED ORDER — ONDANSETRON HCL 4 MG/2ML IJ SOLN
4.0000 mg | Freq: Four times a day (QID) | INTRAMUSCULAR | Status: DC | PRN
  Administered 2019-12-05: 4 mg via INTRAVENOUS
  Filled 2019-12-05: qty 2

## 2019-12-05 MED ORDER — GABAPENTIN 300 MG PO CAPS: 300.0000 mg | ORAL_CAPSULE | ORAL | Status: AC

## 2019-12-05 MED ORDER — METRONIDAZOLE IN NACL 5-0.79 MG/ML-% IV SOLN
500.0000 mg | Freq: Three times a day (TID) | INTRAVENOUS | Status: DC
  Administered 2019-12-05 – 2019-12-06 (×2): 500 mg via INTRAVENOUS
  Filled 2019-12-05 (×2): qty 100

## 2019-12-05 MED ORDER — LIDOCAINE-EPINEPHRINE 1 %-1:100000 IJ SOLN
INTRAMUSCULAR | Status: DC | PRN
  Administered 2019-12-05: 25 mL

## 2019-12-05 MED ORDER — LIDOCAINE 2% (20 MG/ML) 5 ML SYRINGE
INTRAMUSCULAR | Status: DC | PRN
  Administered 2019-12-05: 60 mg via INTRAVENOUS

## 2019-12-05 MED ORDER — BACITRACIN ZINC 500 UNIT/GM EX OINT
TOPICAL_OINTMENT | CUTANEOUS | Status: DC | PRN
  Administered 2019-12-05: 1 via TOPICAL

## 2019-12-05 MED ORDER — DEXAMETHASONE SODIUM PHOSPHATE 10 MG/ML IJ SOLN
INTRAMUSCULAR | Status: DC | PRN
  Administered 2019-12-05: 5 mg via INTRAVENOUS

## 2019-12-05 MED ORDER — FENTANYL CITRATE (PF) 250 MCG/5ML IJ SOLN
INTRAMUSCULAR | Status: DC | PRN
  Administered 2019-12-05: 50 ug via INTRAVENOUS
  Administered 2019-12-05 (×4): 25 ug via INTRAVENOUS
  Administered 2019-12-05: 100 ug via INTRAVENOUS

## 2019-12-05 MED ORDER — ONDANSETRON HCL 4 MG/2ML IJ SOLN
INTRAMUSCULAR | Status: DC | PRN
  Administered 2019-12-05: 4 mg via INTRAVENOUS

## 2019-12-05 MED ORDER — SUGAMMADEX SODIUM 200 MG/2ML IV SOLN
INTRAVENOUS | Status: DC | PRN
  Administered 2019-12-05: 200 mg via INTRAVENOUS

## 2019-12-05 MED ORDER — ONDANSETRON HCL 4 MG/2ML IJ SOLN
4.0000 mg | Freq: Once | INTRAMUSCULAR | Status: AC
  Administered 2019-12-05: 4 mg via INTRAVENOUS
  Filled 2019-12-05: qty 2

## 2019-12-05 MED ORDER — ONDANSETRON 4 MG PO TBDP
4.0000 mg | ORAL_TABLET | Freq: Four times a day (QID) | ORAL | Status: DC | PRN
  Filled 2019-12-05: qty 1

## 2019-12-05 SURGICAL SUPPLY — 46 items
APPLIER CLIP ROT 10 11.4 M/L (STAPLE)
BENZOIN TINCTURE PRP APPL 2/3 (GAUZE/BANDAGES/DRESSINGS) ×2 IMPLANT
CANISTER SUCT 3000ML PPV (MISCELLANEOUS) ×2 IMPLANT
CHLORAPREP W/TINT 26 (MISCELLANEOUS) ×2 IMPLANT
CLIP APPLIE ROT 10 11.4 M/L (STAPLE) IMPLANT
COVER SURGICAL LIGHT HANDLE (MISCELLANEOUS) ×2 IMPLANT
COVER WAND RF STERILE (DRAPES) ×2 IMPLANT
CUTTER FLEX LINEAR 45M (STAPLE) ×2 IMPLANT
DERMABOND ADHESIVE PROPEN (GAUZE/BANDAGES/DRESSINGS) ×1
DERMABOND ADVANCED (GAUZE/BANDAGES/DRESSINGS) ×1
DERMABOND ADVANCED .7 DNX12 (GAUZE/BANDAGES/DRESSINGS) ×1 IMPLANT
DERMABOND ADVANCED .7 DNX6 (GAUZE/BANDAGES/DRESSINGS) ×1 IMPLANT
DRSG TEGADERM 2-3/8X2-3/4 SM (GAUZE/BANDAGES/DRESSINGS) ×6 IMPLANT
ELECT CAUTERY BLADE 6.4 (BLADE) ×2 IMPLANT
ELECT REM PT RETURN 9FT ADLT (ELECTROSURGICAL) ×2
ELECTRODE REM PT RTRN 9FT ADLT (ELECTROSURGICAL) ×1 IMPLANT
ENDOLOOP SUT PDS II  0 18 (SUTURE)
ENDOLOOP SUT PDS II 0 18 (SUTURE) IMPLANT
GLOVE BIO SURGEON STRL SZ 6.5 (GLOVE) ×2 IMPLANT
GLOVE BIOGEL PI IND STRL 6 (GLOVE) ×1 IMPLANT
GLOVE BIOGEL PI INDICATOR 6 (GLOVE) ×1
GOWN STRL REUS W/ TWL LRG LVL3 (GOWN DISPOSABLE) ×3 IMPLANT
GOWN STRL REUS W/TWL LRG LVL3 (GOWN DISPOSABLE) ×3
KIT BASIN OR (CUSTOM PROCEDURE TRAY) ×2 IMPLANT
KIT TURNOVER KIT B (KITS) ×2 IMPLANT
NS IRRIG 1000ML POUR BTL (IV SOLUTION) ×2 IMPLANT
PAD ARMBOARD 7.5X6 YLW CONV (MISCELLANEOUS) ×4 IMPLANT
PENCIL BUTTON HOLSTER BLD 10FT (ELECTRODE) ×2 IMPLANT
POUCH SPECIMEN RETRIEVAL 10MM (ENDOMECHANICALS) ×2 IMPLANT
RELOAD 45 VASCULAR/THIN (ENDOMECHANICALS) ×4 IMPLANT
RELOAD STAPLE TA45 3.5 REG BLU (ENDOMECHANICALS) ×4 IMPLANT
SCISSORS LAP 5X35 DISP (ENDOMECHANICALS) ×2 IMPLANT
SET IRRIG TUBING LAPAROSCOPIC (IRRIGATION / IRRIGATOR) ×2 IMPLANT
SET TUBE SMOKE EVAC HIGH FLOW (TUBING) ×2 IMPLANT
SHEARS HARMONIC ACE PLUS 36CM (ENDOMECHANICALS) IMPLANT
SLEEVE ENDOPATH XCEL 5M (ENDOMECHANICALS) ×2 IMPLANT
SPECIMEN JAR SMALL (MISCELLANEOUS) ×2 IMPLANT
STRIP CLOSURE SKIN 1/2X4 (GAUZE/BANDAGES/DRESSINGS) ×2 IMPLANT
SUT MNCRL AB 4-0 PS2 18 (SUTURE) ×2 IMPLANT
SUT VICRYL 0 UR6 27IN ABS (SUTURE) IMPLANT
TOWEL GREEN STERILE (TOWEL DISPOSABLE) ×2 IMPLANT
TOWEL GREEN STERILE FF (TOWEL DISPOSABLE) ×2 IMPLANT
TRAY LAPAROSCOPIC MC (CUSTOM PROCEDURE TRAY) ×2 IMPLANT
TROCAR XCEL BLUNT TIP 100MML (ENDOMECHANICALS) ×2 IMPLANT
TROCAR XCEL NON-BLD 5MMX100MML (ENDOMECHANICALS) ×2 IMPLANT
WATER STERILE IRR 1000ML POUR (IV SOLUTION) ×2 IMPLANT

## 2019-12-05 NOTE — Anesthesia Postprocedure Evaluation (Signed)
Anesthesia Post Note  Patient: Brandon Murray  Procedure(s) Performed: APPENDECTOMY LAPAROSCOPIC (N/A Abdomen)     Patient location during evaluation: PACU Anesthesia Type: General Level of consciousness: awake and alert Pain management: pain level controlled Vital Signs Assessment: post-procedure vital signs reviewed and stable Respiratory status: spontaneous breathing, nonlabored ventilation, respiratory function stable and patient connected to nasal cannula oxygen Cardiovascular status: blood pressure returned to baseline and stable Postop Assessment: no apparent nausea or vomiting Anesthetic complications: no    Last Vitals:  Vitals:   12/05/19 1852 12/05/19 1955  BP: (!) 143/89 (!) 138/94  Pulse: 76 78  Resp: 18 16  Temp: 36.8 C 36.8 C  SpO2: 96% 96%    Last Pain:  Vitals:   12/05/19 1955  TempSrc: Oral  PainSc:                  Ryan P Ellender

## 2019-12-05 NOTE — Discharge Instructions (Signed)
[facility] ° °Please arrive at least 30 min before your appointment to complete your check in paperwork.  If you are unable to arrive 30 min prior to your appointment time we may have to cancel or reschedule you. °LAPAROSCOPIC SURGERY: POST OP INSTRUCTIONS °Always review your discharge instruction sheet given to you by the facility where your surgery was performed. °IF YOU HAVE DISABILITY OR FAMILY LEAVE FORMS, YOU MUST BRING THEM TO THE OFFICE FOR PROCESSING.   °DO NOT GIVE THEM TO YOUR DOCTOR. ° °PAIN CONTROL ° °1. First take acetaminophen (Tylenol) AND/or ibuprofen (Advil) to control your pain after surgery.  Follow directions on package.  Taking acetaminophen (Tylenol) and/or ibuprofen (Advil) regularly after surgery will help to control your pain and lower the amount of prescription pain medication you may need.  You should not take more than 4,000 mg (4 grams) of acetaminophen (Tylenol) in 24 hours.  You should not take ibuprofen (Advil), aleve, motrin, naprosyn or other NSAIDS if you have a history of stomach ulcers or chronic kidney disease.  °2. A prescription for pain medication may be given to you upon discharge.  Take your pain medication as prescribed, if you still have uncontrolled pain after taking acetaminophen (Tylenol) or ibuprofen (Advil). °3. Use ice packs to help control pain. °4. If you need a refill on your pain medication, please contact your pharmacy.  They will contact our office to request authorization. Prescriptions will not be filled after 5pm or on week-ends. ° °HOME MEDICATIONS °5. Take your usually prescribed medications unless otherwise directed. ° °DIET °6. You should follow a light diet the first few days after arrival home.  Be sure to include lots of fluids daily. Avoid fatty, fried foods.  ° °CONSTIPATION °7. It is common to experience some constipation after surgery and if you are taking pain medication.  Increasing fluid intake and taking a stool  softener (such as Colace) will usually help or prevent this problem from occurring.  A mild laxative (Milk of Magnesia or Miralax) should be taken according to package instructions if there are no bowel movements after 48 hours. ° °WOUND/INCISION CARE °8. Most patients will experience some swelling and bruising in the area of the incisions.  Ice packs will help.  Swelling and bruising can take several days to resolve.  °9. Unless discharge instructions indicate otherwise, follow guidelines below  °a. STERI-STRIPS - you may remove your outer bandages 48 hours after surgery, and you may shower at that time.  You have steri-strips (small skin tapes) in place directly over the incision.  These strips should be left on the skin for 7-10 days.   °b. DERMABOND/SKIN GLUE - you may shower in 24 hours.  The glue will flake off over the next 2-3 weeks. °10. Any sutures or staples will be removed at the office during your follow-up visit. ° °ACTIVITIES °11. You may resume regular (light) daily activities beginning the next day--such as daily self-care, walking, climbing stairs--gradually increasing activities as tolerated.  You may have sexual intercourse when it is comfortable.  Refrain from any heavy lifting or straining until approved by your doctor. °a. You may drive when you are no longer taking prescription pain medication, you can comfortably wear a seatbelt, and you can safely maneuver your car and apply brakes. ° °FOLLOW-UP °12. You should see your doctor in the office for a follow-up appointment approximately 2-3 weeks after your surgery.  You should have been given your post-op/follow-up appointment when   your surgery was scheduled.  If you did not receive a post-op/follow-up appointment, make sure that you call for this appointment within a day or two after you arrive home to insure a convenient appointment time. ° °OTHER INSTRUCTIONS ° °WHEN TO CALL YOUR DOCTOR: °1. Fever over 101.0 °2. Inability to  urinate °3. Continued bleeding from incision. °4. Increased pain, redness, or drainage from the incision. °5. Increasing abdominal pain ° °The clinic staff is available to answer your questions during regular business hours.  Please don’t hesitate to call and ask to speak to one of the nurses for clinical concerns.  If you have a medical emergency, go to the nearest emergency room or call 911.  A surgeon from [facility] is always on call at the hospital. °[address], [address], [place], [place]  [address] ? [address] [address], [place], [place]   [address] °[phone] ? [phone] ? FAX [phone] ° ° ° °

## 2019-12-05 NOTE — ED Triage Notes (Signed)
Pt in w/RLQ dull aching pain, began yesterday as diffuse general. This am, pain and tenderness to RLQ, emesis x 1 PTA.

## 2019-12-05 NOTE — Anesthesia Procedure Notes (Signed)
Procedure Name: Intubation Date/Time: 12/05/2019 4:39 PM Performed by: Janene Harvey, CRNA Pre-anesthesia Checklist: Patient identified, Emergency Drugs available, Suction available and Patient being monitored Patient Re-evaluated:Patient Re-evaluated prior to induction Oxygen Delivery Method: Circle system utilized Preoxygenation: Pre-oxygenation with 100% oxygen Induction Type: IV induction and Rapid sequence Laryngoscope Size: Mac and 4 Grade View: Grade I Tube type: Oral Tube size: 7.5 mm Number of attempts: 1 Airway Equipment and Method: Stylet and Oral airway Placement Confirmation: ETT inserted through vocal cords under direct vision,  positive ETCO2 and breath sounds checked- equal and bilateral Secured at: 22 cm Tube secured with: Tape Dental Injury: Teeth and Oropharynx as per pre-operative assessment

## 2019-12-05 NOTE — H&P (Signed)
[facility] Surgery Admission Note  Brandon Murray 1981-01-15  [id number].    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 Reports abdominal pain persisted, became more severe a localized to the RLQ Sunday night. He presented to the ED early this morning for evaluation. Associated sxs include one episode of dry-heaving and general malaise. Denies fever, chills, diarrhea. At baseline has 3, loose, non-bloody BMS daily.  NKDA. Denies smoking or illicit drug use. Reports occasional EtOH use, not daily. Denies previous complications under general anesthesia. PSH includes cholecystectomy.  ROS: Review of Systems  Constitutional: Positive for chills (during episode dry-heaves) and malaise/fatigue. Negative for fever.  HENT: Negative.   Eyes: Negative.   Respiratory: Negative.   Cardiovascular: Negative.   Gastrointestinal: Positive for abdominal pain. Negative for blood in stool, diarrhea, heartburn, melena, nausea and vomiting.  Genitourinary: Negative.   Musculoskeletal: Negative.   Skin: Negative.   Neurological: Negative.   Endo/Heme/Allergies: Negative.   Psychiatric/Behavioral: Negative.     Family History  Problem Relation Age of Onset  . Diabetes Father   . Diabetes Maternal Grandmother   . Cancer Maternal Grandfather   . Diabetes Maternal Grandfather   . Heart disease Paternal Grandmother     Past Medical History:  Diagnosis Date  . History of fractured pelvis 2010    Past Surgical History:  Procedure Laterality Date  . CHOLECYSTECTOMY  2010    Social History:  reports that he has never smoked. He has never used smokeless tobacco. He reports  current alcohol use of about 1.0 - 2.0 standard drinks of alcohol per week. He reports that he does not use drugs.  Allergies: No Known Allergies  (Not in a hospital admission)   Blood pressure 121/72, pulse 92, temperature 98.4 F (36.9 C), temperature source Oral, resp. rate 18, weight 90.7 kg, SpO2 100 %. Physical Exam: Constitutional: NAD; conversant; no deformities Eyes: Moist conjunctiva; no lid lag; anicteric; PERRL  Neck: Trachea midline; no thyromegaly Lungs: Normal respiratory effort; no tactile fremitus, CTAB without wheezes, rales, rhonchi  CV: RRR; no palpable thrills; no pitting edema GI: Abd soft, TTP RLQ with guarding, palpation of LLQ elicits pain in RLQ, no palpable hepatosplenomegaly MSK: symmetrical, no clubbing/cyanosis Psychiatric: Appropriate affect; alert and oriented x3 Lymphatic: No palpable cervical or axillary lymphadenopathy  Results for orders placed or performed during the hospital encounter of 12/05/19 (from the past 48 hour(s))  Lipase, blood     Status: None   Collection Time: 12/05/19  7:21 AM  Result Value Ref Range   Lipase 20 11 - 51 U/L    Comment: Performed at [facility], [place] [address], [employer] [employer], [place] [address]  Comprehensive metabolic panel     Status: Abnormal   Collection Time: 12/05/19  7:21 AM  Result Value Ref Range   Sodium 136 135 - 145 mmol/L   Potassium 4.0 3.5 - 5.1 mmol/L   Chloride 102 98 - 111 mmol/L   CO2 22 22 - 32 mmol/L   Glucose, Bld 127 (H) 70 - 99 mg/dL    Comment: Glucose reference range applies only to samples taken after fasting for at least 8 hours.  BUN 6 6 - 20 mg/dL   Creatinine, Ser 1.16 0.61 - 1.24 mg/dL   Calcium 9.2 8.9 - 10.3 mg/dL   Total Protein 7.3 6.5 - 8.1 g/dL   Albumin 4.0 3.5 - 5.0 g/dL   AST 16 15 - 41 U/L   ALT 18 0 - 44 U/L   Alkaline Phosphatase 81 38 - 126 U/L   Total Bilirubin 1.5 (H) 0.3 - 1.2 mg/dL   GFR calc non Af Amer >60 >60 mL/min   GFR calc Af Amer >60 >60 mL/min    Anion gap 12 5 - 15    Comment: Performed at [facility], [place] [address] [place] [employer] [place], [place] [address]  CBC     Status: Abnormal   Collection Time: 12/05/19  7:21 AM  Result Value Ref Range   WBC 18.7 (H) 4.0 - 10.5 K/uL   RBC 5.22 4.22 - 5.81 MIL/uL   Hemoglobin 14.7 13.0 - 17.0 g/dL   HCT 44.0 39.0 - 52.0 %   MCV 84.3 80.0 - 100.0 fL   MCH 28.2 26.0 - 34.0 pg   MCHC 33.4 30.0 - 36.0 g/dL   RDW 12.4 11.5 - 15.5 %   Platelets 374 150 - 400 K/uL   nRBC 0.0 0.0 - 0.2 %    Comment: Performed at [facility], [place] [address], [place], [place] [address]  SARS Coronavirus 2 by RT PCR (hospital order, performed in [facility] hospital lab) Nasopharyngeal Nasopharyngeal Swab     Status: None   Collection Time: 12/05/19  9:52 AM   Specimen: Nasopharyngeal Swab  Result Value Ref Range   SARS Coronavirus 2 NEGATIVE NEGATIVE    Comment: (NOTE) SARS-CoV-2 target nucleic acids are NOT DETECTED. The SARS-CoV-2 RNA is generally detectable in upper and lower respiratory specimens during the acute phase of infection. The lowest concentration of SARS-CoV-2 viral copies this assay can detect is 250 copies / mL. A negative result does not preclude SARS-CoV-2 infection and should not be used as the sole basis for treatment or other patient management decisions.  A negative result may occur with improper specimen collection / handling, submission of specimen other than nasopharyngeal swab, presence of viral mutation(s) within the areas targeted by this assay, and inadequate number of viral copies (<250 copies / mL). A negative result must be combined with clinical observations, patient history, and epidemiological information. Fact Sheet for Patients:   StrictlyIdeas.no Fact Sheet for Healthcare Providers: BankingDealers.co.za This test is not yet approved or cleared  by the [place] FDA and has been authorized for detection and/or  diagnosis of SARS-CoV-2 by FDA under an Emergency Use Authorization (EUA).  This EUA will remain in effect (meaning this test can be used) for the duration of the COVID-19 declaration under Section 564(b)(1) of the Act, 21 U.S.C. section 360bbb-3(b)(1), unless the authorization is terminated or revoked sooner. Performed at [facility], [place] [address], [place], [place] [address]   Urinalysis, Routine w reflex microscopic     Status: Abnormal   Collection Time: 12/05/19 11:04 AM  Result Value Ref Range   Color, Urine YELLOW YELLOW   APPearance HAZY (A) CLEAR   Specific Gravity, Urine 1.014 1.005 - 1.030   pH 6.0 5.0 - 8.0   Glucose, UA NEGATIVE NEGATIVE mg/dL   Hgb urine dipstick SMALL (A) NEGATIVE   Bilirubin Urine NEGATIVE NEGATIVE   Ketones, ur 80 (A) NEGATIVE mg/dL   Protein, ur NEGATIVE NEGATIVE mg/dL  Nitrite NEGATIVE NEGATIVE   Leukocytes,Ua NEGATIVE NEGATIVE   RBC / HPF 0-5 0 - 5 RBC/hpf   WBC, UA 0-5 0 - 5 WBC/hpf   Bacteria, UA RARE (A) NONE SEEN   Mucus PRESENT     Comment: Performed at [facility], [place] [address] [place], [place] [address]   Assessment/Plan Depression - continue home Celexa post-operatively   Acute appendicitis with appendicolith Leukocytosis 18.7 - NPO, IVF  - IV Rocephin/Flagyl  - to OR for laparoscopic appendectomy  - ERAS pre-medication ordered   Non-operative management of acute appendicitis discussed with the patient.  The risk of surgery including bleeding, infection, damage to surrounding structures, ileocecectomy or bowel resection, conversion to open were dicussed with the patient and he would like to proceed with surgery.  Jill Alexanders, [facility] 12/05/2019, 1:42 PM

## 2019-12-05 NOTE — Anesthesia Preprocedure Evaluation (Addendum)
Anesthesia Evaluation  Patient identified by MRN, date of birth, ID band Patient awake    Reviewed: Allergy & Precautions, NPO status , Patient's Chart, lab work & pertinent test results  Airway Mallampati: II  TM Distance: >3 FB Neck ROM: Full    Dental no notable dental hx. (+) Teeth Intact, Dental Advisory Given   Pulmonary neg pulmonary ROS,    Pulmonary exam normal breath sounds clear to auscultation       Cardiovascular negative cardio ROS Normal cardiovascular exam Rhythm:Regular Rate:Normal     Neuro/Psych negative neurological ROS  negative psych ROS   GI/Hepatic Neg liver ROS, Acute appendicitis   Endo/Other  negative endocrine ROS  Renal/GU negative Renal ROS  negative genitourinary   Musculoskeletal negative musculoskeletal ROS (+)   Abdominal Normal abdominal exam  (+)   Peds negative pediatric ROS (+)  Hematology negative hematology ROS (+)   Anesthesia Other Findings   Reproductive/Obstetrics negative OB ROS                            Anesthesia Physical Anesthesia Plan  ASA: II  Anesthesia Plan: General   Post-op Pain Management:    Induction: Intravenous and Rapid sequence  PONV Risk Score and Plan: 3 and Ondansetron, Dexamethasone, Midazolam and Treatment may vary due to age or medical condition  Airway Management Planned: Oral ETT  Additional Equipment: None  Intra-op Plan:   Post-operative Plan: Extubation in OR  Informed Consent: I have reviewed the patients History and Physical, chart, labs and discussed the procedure including the risks, benefits and alternatives for the proposed anesthesia with the patient or authorized representative who has indicated his/her understanding and acceptance.     Dental advisory given  Plan Discussed with: CRNA  Anesthesia Plan Comments:        Anesthesia Quick Evaluation

## 2019-12-05 NOTE — Progress Notes (Signed)
Patient arrived to room in NAD, VS stable and patient free from pain. Patient oriented to room and call bell in reach.

## 2019-12-05 NOTE — ED Provider Notes (Signed)
[facility] EMERGENCY DEPARTMENT Provider Note   CSN: [id number] Arrival date & time: 12/05/19  [id number]     History Chief Complaint  Patient presents with  . Abdominal Pain    Brandon Murray is a 39 y.o. male with a history of prior cholecystectomy who presents to the ED with complaints of abdominal pain for the past 2-3 days. Pain started in the generalized abdomen then seemed to localize to the RLQ. Discomfort is constant, seems to be progressively worsening, currently a 6/10 in severity, worse with movement, no alleviating factors. Tried tylenol without much relief. Has had nausea with 1 episode of dry heaving this morning. Denies fever, chills, hematemesis, diarrhea, melena, constipation, dysuria, or testicular pain/swelling. Last PO intake was some water early this morning.  HPI     Past Medical History:  Diagnosis Date  . History of fractured pelvis 2010    Patient Active Problem List   Diagnosis Date Noted  . 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 Depression, major, single episode, moderate ([place]) 03/17/2018  . Stress and adjustment reaction 03/17/2018    Past Surgical History:  Procedure Laterality Date  . CHOLECYSTECTOMY  2010       Family History  Problem Relation Age of Onset  . Diabetes Father   . Diabetes Maternal Grandmother   . Cancer Maternal Grandfather   . Diabetes Maternal Grandfather   . Heart disease Paternal Grandmother     Social History   Tobacco Use  . Smoking status: Never Smoker  . Smokeless tobacco: Never Used  Substance Use Topics  . Alcohol use: Yes    Alcohol/week: 1.0 - 2.0 standard drinks    Types: 1 - 2 Cans of beer per week    Comment: drinks 1-2 beers weekly  . Drug use: Never    Home Medications Prior to Admission medications   Medication Sig Start Date End Date Taking? Authorizing Provider  citalopram (CELEXA) 10 MG tablet Take 1 tablet (10 mg total) by mouth daily. 07/09/19   Leone Haven, MD    finasteride (PROPECIA) 1 MG tablet TAKE 1 TABLET BY MOUTH DAILY. 10/13/19   Leone Haven, MD    Allergies    Patient has no known allergies.  Review of Systems   Review of Systems  Constitutional: Negative for chills and fever.  Respiratory: Negative for shortness of breath.   Cardiovascular: Negative for chest pain.  Gastrointestinal: Positive for abdominal pain, nausea and vomiting. Negative for anal bleeding, blood in stool, constipation and diarrhea.  Genitourinary: Negative for dysuria, scrotal swelling and testicular pain.  Neurological: Negative for syncope.  All other systems reviewed and are negative.   Physical Exam Updated Vital Signs BP 138/79 (BP Location: Right Arm)   Pulse 90   Temp 98.4 F (36.9 C) (Oral)   Resp 18   Wt 90.7 kg   SpO2 99%   BMI 29.53 kg/m   Physical Exam Vitals and nursing note reviewed.  Constitutional:      General: He is not in acute distress.    Appearance: He is well-developed. He is not toxic-appearing.  HENT:     Head: Normocephalic and atraumatic.  Eyes:     General:        Right eye: No discharge.        Left eye: No discharge.     Conjunctiva/sclera: Conjunctivae normal.  Cardiovascular:     Rate and Rhythm: Normal rate and regular rhythm.  Pulmonary:  Effort: Pulmonary effort is normal. No respiratory distress.     Breath sounds: Normal breath sounds. No wheezing, rhonchi or rales.  Abdominal:     General: There is no distension.     Palpations: Abdomen is soft.     Tenderness: There is abdominal tenderness in the right lower quadrant. There is guarding (mild). Positive signs include McBurney's sign.  Musculoskeletal:     Cervical back: Neck supple.  Skin:    General: Skin is warm and dry.     Findings: No rash.  Neurological:     Mental Status: He is alert.     Comments: Clear speech.   Psychiatric:        Behavior: Behavior normal.     ED Results / Procedures / Treatments   Labs (all labs ordered  are listed, but only abnormal results are displayed) Labs Reviewed  COMPREHENSIVE METABOLIC PANEL - Abnormal; Notable for the following components:      Result Value   Glucose, Bld 127 (*)    Total Bilirubin 1.5 (*)    All other components within normal limits  CBC - Abnormal; Notable for the following components:   WBC 18.7 (*)    All other components within normal limits  LIPASE, BLOOD  URINALYSIS, ROUTINE W REFLEX MICROSCOPIC    EKG EKG Interpretation  Date/Time:  Monday Dec 05 2019 07:09:40 EDT Ventricular Rate:  92 PR Interval:  130 QRS Duration: 92 QT Interval:  334 QTC Calculation: 413 R Axis:   132 Text Interpretation: Normal sinus rhythm Right axis deviation Abnormal ECG Confirmed by Lennice Sites [phone]) on 12/05/2019 9:04:59 AM   Radiology CT Abdomen Pelvis W Contrast  Result Date: 12/05/2019 CLINICAL DATA:  Right lower quadrant pain beginning yesterday. Clinical suspicion for appendicitis. EXAM: CT ABDOMEN AND PELVIS WITH CONTRAST TECHNIQUE: Multidetector CT imaging of the abdomen and pelvis was performed using the standard protocol following bolus administration of intravenous contrast. CONTRAST:  165mL OMNIPAQUE IOHEXOL 300 MG/ML  SOLN COMPARISON:  None. FINDINGS: Lower Chest: No acute findings. Hepatobiliary: No hepatic masses identified. Prior cholecystectomy. No evidence of biliary obstruction. Pancreas:  No mass or inflammatory changes. Spleen: Within normal limits in size and appearance. Adrenals/Urinary Tract: No masses identified. No evidence of ureteral calculi or hydronephrosis. Stomach/Bowel: Moderate periappendiceal inflammatory changes are seen, with specific findings as follows: Appendix: Location- Standard Diameter-15 mm Appendicolith- Present Mucosal hyper-enhancement- Present Extraluminal Gas- Absent Periappendiceal Collection- None Vascular/Lymphatic: No pathologically enlarged lymph nodes. No abdominal aortic aneurysm. Reproductive:  No mass or other  significant abnormality. Other:  None. Musculoskeletal:  No suspicious bone lesions identified. IMPRESSION: Positive for acute appendicitis. No evidence of abscess or other complication. Electronically Signed   By: Marlaine Hind M.D.   On: 12/05/2019 11:33    Procedures Procedures (including critical care time)  Medications Ordered in ED Medications  sodium chloride flush (NS) 0.9 % injection 3 mL (has no administration in time range)    ED Course  I have reviewed the triage vital signs and the nursing notes.  Pertinent labs & imaging results that were available during my care of the patient were reviewed by me and considered in my medical decision making (see chart for details).    MDM Rules/Calculators/A&P                     Patient presents to the ED with complaints of abdominal pain. Nontoxic, vitals WNL. On exam patient is tender to palpation to the RLQ, mild guarding  noted.  Ddx: Most concerned for appendicitis, additionally considering perforation, abscess, diverticulitis, hernia, or viral illness.  Patient is status post cholecystectomy.  Additional history obtained:  Additional history obtained from review of nursing notes & medical record review.   Lab Tests:  I Ordered, reviewed, and interpreted labs, which included:  CBC: Leukocytosis at 18.7.  No anemia. CMP: Mildly elevated total bili and glucose.  Otherwise unremarkable Lipase: WNL UA: Not consistent w/ UTI, ketonuria & hgb noted.  COVID testing: Negative Imaging Studies ordered:  I ordered imaging studies which included CT A/P, I independently visualized and interpreted imaging which showed findings consistent with acute appendicitis.   ED Course:  09:10: Initial evaluation of the patient, right lower quadrant tenderness with mild guarding noted.  Discussed pain management, patient would like to start with only 2 mg of morphine, will continue to monitor.  10:45: RE-EVAL: Patient resting comfortably, declined  further analgesics at this time.   11:37: Acute appendicitis on CT, abx order placed, will discuss with general surgery on call.   12:25: CONSULT: Discussed case with general surgeon Dr. Bobbye Morton, will come to the ED to see patient.   13:00: Patient with some increased pain, additional analgesics ordered.   13:20: Re-discussed with general surgery team- will admit with plan for OR.   Portions of this note were generated with Lobbyist. Dictation errors may occur despite best attempts at proofreading.  Final Clinical Impression(s) / ED Diagnoses Final diagnoses:  Acute appendicitis, unspecified acute appendicitis type    Rx / DC Orders ED Discharge Orders    None       Amaryllis Dyke, PA-C 12/05/19 1334    Lennice Sites, DO 12/05/19 1457

## 2019-12-05 NOTE — Op Note (Signed)
   Operative Note   Date: 12/05/2019  Procedure: laparoscopic appendectomy  Pre-op diagnosis: acute appendicitis Post-op diagnosis: Grade 2 appendicitis: perforated appendix  Indication and clinical history: The patient is a 39 y.o. year old male with acute appendicitis     Surgeon: Jesusita Oka, MD  Anesthesia: General  Findings:  . Specimen: appendix . EBL: <10cc . Drains/Implants: none   Disposition: PACU - hemodynamically stable.  Description of procedure: The patient was positioned supine on the operating room table. Time-out was performed verifying correct patient, procedure, signature of informed consent, and administration of pre-operative antibiotics. General anesthetic induction and intubation were uneventful. Foley catheter insertion was not performed as patient voided immediately prior to the procedure . The abdomen was prepped and draped in the usual sterile fashion. An infra-umbilical incision was made using an open technique using zero vicryl stay sutures on either side of the fascia and a 86mm Hassan port inserted. After establishing pneumoperitoneum, which the patient tolerated well, the abdominal cavity was inspected and no injury of any intra-abdominal structures was identified. Two additional five millimeter ports were placed under direct visualization and using local anesthetic in the suprapubic and left lower quadrant regions. The patient was repositioned to Trendelenburg with the left side down. Further inspection of the right lower quadrant revealed purulent fluid and grade 2 appendicitis. Adhesiolysis was performed for approximately 45 minutes during dissection, mobilization, and identification of the appendix. The appendix was dissected away from its mesoappendix and an endoscopic stapler used to divide the mesoappendix using a vascular load. A bowel load of the endoscopic stapler was used to staple across the appendix at its base. Both staple lines were inspected  and found to be intact and without bleeding. The appendix was placed in an endoscopic specimen retrieval bag, removed via the umbilical port site, and sent to pathology as a specimen. The right lower quadrant was again inspected and hemostasis confirmed. Any remaining fluid identified was suctioned. After removal of all ports, the fascia was re-approximated using the stay sutures. Additional local anesthetic was administered at the umbilical incision site. The skin of all port sites was closed with 4-0 monocryl. Sterile dressings were applied. All sponge and instrument counts were correct at the conclusion of the procedure. The patient was awakened from anesthesia, extubated uneventfully, and transported to the PACU in good condition. There were no complications.   Clinical update provided to patient's father via phone.    Jesusita Oka, MD General and [facility]

## 2019-12-05 NOTE — Transfer of Care (Signed)
Immediate Anesthesia Transfer of Care Note  Patient: Brandon Murray  Procedure(s) Performed: APPENDECTOMY LAPAROSCOPIC (N/A Abdomen)  Patient Location: PACU  Anesthesia Type:General  Level of Consciousness: drowsy  Airway & Oxygen Therapy: Patient Spontanous Breathing and Patient connected to face mask oxygen  Post-op Assessment: Report given to RN and Post -op Vital signs reviewed and stable  Post vital signs: Reviewed  Last Vitals:  Vitals Value Taken Time  BP    Temp    Pulse 87 12/05/19 1825  Resp 20 12/05/19 1825  SpO2 98 % 12/05/19 1825  Vitals shown include unvalidated device data.  Last Pain:  Vitals:   12/05/19 1815  TempSrc:   PainSc: (P) Asleep      Patients Stated Pain Goal: 3 ([id number])  Complications: No apparent anesthesia complications

## 2019-12-06 DIAGNOSIS — K353 Acute appendicitis with localized peritonitis, without perforation or gangrene: Secondary | ICD-10-CM | POA: Diagnosis not present

## 2019-12-06 DIAGNOSIS — L649 Androgenic alopecia, unspecified: Secondary | ICD-10-CM | POA: Diagnosis not present

## 2019-12-06 DIAGNOSIS — F329 Major depressive disorder, single episode, unspecified: Secondary | ICD-10-CM | POA: Diagnosis not present

## 2019-12-06 DIAGNOSIS — Z79899 Other long term (current) drug therapy: Secondary | ICD-10-CM | POA: Diagnosis not present

## 2019-12-06 DIAGNOSIS — Z20822 Contact with and (suspected) exposure to covid-19: Secondary | ICD-10-CM | POA: Diagnosis not present

## 2019-12-06 LAB — CBC
HCT: 37.9 % — ABNORMAL LOW (ref 39.0–52.0)
Hemoglobin: 12.5 g/dL — ABNORMAL LOW (ref 13.0–17.0)
MCH: 28.5 pg (ref 26.0–34.0)
MCHC: 33 g/dL (ref 30.0–36.0)
MCV: 86.5 fL (ref 80.0–100.0)
Platelets: 263 10*3/uL (ref 150–400)
RBC: 4.38 MIL/uL (ref 4.22–5.81)
RDW: 12.5 % (ref 11.5–15.5)
WBC: 17.3 10*3/uL — ABNORMAL HIGH (ref 4.0–10.5)
nRBC: 0 % (ref 0.0–0.2)

## 2019-12-06 LAB — BASIC METABOLIC PANEL
Anion gap: 12 (ref 5–15)
BUN: 6 mg/dL (ref 6–20)
CO2: 22 mmol/L (ref 22–32)
Calcium: 8.2 mg/dL — ABNORMAL LOW (ref 8.9–10.3)
Chloride: 100 mmol/L (ref 98–111)
Creatinine, Ser: 1.04 mg/dL (ref 0.61–1.24)
GFR calc Af Amer: >60 mL/min (ref >60)
GFR calc non Af Amer: >60 mL/min (ref >60)
Glucose, Bld: 155 mg/dL — ABNORMAL HIGH (ref 70–99)
Potassium: 4.6 mmol/L (ref 3.5–5.1)
Sodium: 134 mmol/L — ABNORMAL LOW (ref 135–145)

## 2019-12-06 LAB — HIV ANTIBODY (ROUTINE TESTING W REFLEX): HIV Screen 4th Generation wRfx: NONREACTIVE

## 2019-12-06 MED ORDER — IBUPROFEN 200 MG PO TABS: 400.0000 mg | ORAL_TABLET | Freq: Three times a day (TID) | ORAL | 2 refills | Status: DC | PRN

## 2019-12-06 MED ORDER — ACETAMINOPHEN 500 MG PO TABS: 1000.0000 mg | ORAL_TABLET | Freq: Four times a day (QID) | ORAL | 0 refills | Status: DC

## 2019-12-06 NOTE — Discharge Summary (Signed)
[facility] Discharge Summary   Patient ID: Brandon Murray MRN: [id number] DOB/AGE: 37-Dec-1982 37 y.o.  Admit date: 12/05/2019 Discharge date: 12/06/2019  Discharge Diagnosis Patient Active Problem List   Diagnosis Date Noted  . Acute appendicitis 12/05/2019  . 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 Depression, major, single episode, moderate ([place]) 03/17/2018  . Stress and adjustment reaction 03/17/2018    Consultants None   Imaging: CT Abdomen Pelvis W Contrast  Result Date: 12/05/2019 CLINICAL DATA:  Right lower quadrant pain beginning yesterday. Clinical suspicion for appendicitis. EXAM: CT ABDOMEN AND PELVIS WITH CONTRAST TECHNIQUE: Multidetector CT imaging of the abdomen and pelvis was performed using the standard protocol following bolus administration of intravenous contrast. CONTRAST:  139mL OMNIPAQUE IOHEXOL 300 MG/ML  SOLN COMPARISON:  None. FINDINGS: Lower Chest: No acute findings. Hepatobiliary: No hepatic masses identified. Prior cholecystectomy. No evidence of biliary obstruction. Pancreas:  No mass or inflammatory changes. Spleen: Within normal limits in size and appearance. Adrenals/Urinary Tract: No masses identified. No evidence of ureteral calculi or hydronephrosis. Stomach/Bowel: Moderate periappendiceal inflammatory changes are seen, with specific findings as follows: Appendix: Location- Standard Diameter-15 mm Appendicolith- Present Mucosal hyper-enhancement- Present Extraluminal Gas- Absent Periappendiceal Collection- None Vascular/Lymphatic: No pathologically enlarged lymph nodes. No abdominal aortic aneurysm. Reproductive:  No mass or other significant abnormality. Other:  None. Musculoskeletal:  No suspicious bone lesions identified. IMPRESSION: Positive for acute appendicitis. No evidence of abscess or other complication. Electronically Signed   By: Marlaine Hind M.D.   On: 12/05/2019 11:33    Procedures Dr. Reather Laurence (12/05/2019) - Laparoscopic  Appendectomy  Hospital Course:  Dr. Benay Pike is a 39 y/o male with a PMH depression who presented to the ED with a cc 2-3 days of abdominal pain associate with dry-heaves.  Workup showed acute appendicitis with appendicolith and WBC 18,700.  Patient was admitted and underwent procedure listed above. Intraoperatively the appendix was found to have a small contained perforation. The patient tolerated procedure well and was transferred to the floor.  Diet was advanced as tolerated.  On POD#1, the patient was voiding well, tolerating diet, ambulating well, pain well controlled, vital signs stable, incisions c/d/i and felt stable for discharge home.  Patient will follow up in our office in 2 weeks and knows to call with questions or concerns.  He will call to confirm appointment date/time.    Physical Exam: General:  Alert, NAD, pleasant, comfortable Abd:  Soft, ND, appropriately tender, incisions C/D/I covered with gauze and tegaderm, LLQ incision with scant SS drainage on gauze.  Allergies as of 12/06/2019   No Known Allergies     Medication List    TAKE these medications   acetaminophen 500 MG tablet Commonly known as: TYLENOL Take 2 tablets (1,000 mg total) by mouth every 6 (six) hours.   citalopram 10 MG tablet Commonly known as: CELEXA Take 1 tablet (10 mg total) by mouth daily. Notes to patient: 12/06/19   finasteride 1 MG tablet Commonly known as: PROPECIA TAKE 1 TABLET BY MOUTH DAILY. Notes to patient: Continue home schedule   ibuprofen 200 MG tablet Commonly known as: Motrin IB Take 2-3 tablets (400-600 mg total) by mouth every 8 (eight) hours as needed.        Follow-up [facility], [place]. Call.   Specialty: General Surgery Why: We are working on your appointment, call to confirm Please arrive 30 minutes prior to your appointment to check in and fill out paperwork. Bring  photo ID and insurance information. Contact information: [address] [place] [phone]          Signed: Obie Dredge, [facility] 12/06/2019, 12:58 PM

## 2019-12-07 LAB — SURGICAL PATHOLOGY

## 2020-01-17 ENCOUNTER — Other Ambulatory Visit: Payer: Self-pay | Admitting: Family Medicine

## 2020-01-17 DIAGNOSIS — F321 Major depressive disorder, single episode, moderate: Secondary | ICD-10-CM

## 2020-02-21 ENCOUNTER — Other Ambulatory Visit: Payer: Self-pay

## 2020-02-21 ENCOUNTER — Encounter: Payer: Self-pay | Admitting: Nurse Practitioner

## 2020-02-21 ENCOUNTER — Ambulatory Visit: Payer: 59 | Admitting: Nurse Practitioner

## 2020-02-21 ENCOUNTER — Other Ambulatory Visit: Payer: Self-pay | Admitting: Nurse Practitioner

## 2020-02-21 VITALS — BP 112/70 | HR 62 | Temp 98.3°F | Ht 69.0 in | Wt 194.0 lb

## 2020-02-21 DIAGNOSIS — R7989 Other specified abnormal findings of blood chemistry: Secondary | ICD-10-CM

## 2020-02-21 DIAGNOSIS — K625 Hemorrhage of anus and rectum: Secondary | ICD-10-CM

## 2020-02-21 DIAGNOSIS — R197 Diarrhea, unspecified: Secondary | ICD-10-CM | POA: Diagnosis not present

## 2020-02-21 DIAGNOSIS — F419 Anxiety disorder, unspecified: Secondary | ICD-10-CM | POA: Diagnosis not present

## 2020-02-21 DIAGNOSIS — F329 Major depressive disorder, single episode, unspecified: Secondary | ICD-10-CM

## 2020-02-21 DIAGNOSIS — E782 Mixed hyperlipidemia: Secondary | ICD-10-CM | POA: Diagnosis not present

## 2020-02-21 DIAGNOSIS — L649 Androgenic alopecia, unspecified: Secondary | ICD-10-CM

## 2020-02-21 DIAGNOSIS — K9041 Non-celiac gluten sensitivity: Secondary | ICD-10-CM | POA: Diagnosis not present

## 2020-02-21 DIAGNOSIS — F32A Depression, unspecified: Secondary | ICD-10-CM

## 2020-02-21 DIAGNOSIS — Z6828 Body mass index (BMI) 28.0-28.9, adult: Secondary | ICD-10-CM

## 2020-02-21 DIAGNOSIS — Z0000 Encounter for general adult medical examination without abnormal findings: Secondary | ICD-10-CM | POA: Diagnosis not present

## 2020-02-21 LAB — COMPREHENSIVE METABOLIC PANEL
ALT: 17 U/L (ref 0–53)
AST: 28 U/L (ref 0–37)
Albumin: 4.6 g/dL (ref 3.5–5.2)
Alkaline Phosphatase: 85 U/L (ref 39–117)
BUN: 8 mg/dL (ref 6–23)
CO2: 29 mEq/L (ref 19–32)
Calcium: 10 mg/dL (ref 8.4–10.5)
Chloride: 101 mEq/L (ref 96–112)
Creatinine, Ser: 1.01 mg/dL (ref 0.40–1.50)
GFR: 83 mL/min (ref >60.00)
Glucose, Bld: 91 mg/dL (ref 70–99)
Potassium: 4.6 mEq/L (ref 3.5–5.1)
Sodium: 135 mEq/L (ref 135–145)
Total Bilirubin: 0.8 mg/dL (ref 0.2–1.2)
Total Protein: 7.5 g/dL (ref 6.0–8.3)

## 2020-02-21 LAB — CBC WITH DIFFERENTIAL/PLATELET
Basophils Absolute: 0.1 10*3/uL (ref 0.0–0.1)
Basophils Relative: 1.2 % (ref 0.0–3.0)
Eosinophils Absolute: 0.1 10*3/uL (ref 0.0–0.7)
Eosinophils Relative: 2.4 % (ref 0.0–5.0)
HCT: 42.8 % (ref 39.0–52.0)
Hemoglobin: 14.4 g/dL (ref 13.0–17.0)
Lymphocytes Relative: 30.2 % (ref 12.0–46.0)
Lymphs Abs: 1.8 10*3/uL (ref 0.7–4.0)
MCHC: 33.7 g/dL (ref 30.0–36.0)
MCV: 85.3 fl (ref 78.0–100.0)
Monocytes Absolute: 0.6 10*3/uL (ref 0.1–1.0)
Monocytes Relative: 9.1 % (ref 3.0–12.0)
Neutro Abs: 3.5 10*3/uL (ref 1.4–7.7)
Neutrophils Relative %: 57.1 % (ref 43.0–77.0)
Platelets: 321 10*3/uL (ref 150.0–400.0)
RBC: 5.02 Mil/uL (ref 4.22–5.81)
RDW: 14.1 % (ref 11.5–15.5)
WBC: 6.1 10*3/uL (ref 4.0–10.5)

## 2020-02-21 LAB — B12 AND FOLATE PANEL
Folate: 8.7 ng/mL (ref >5.9)
Vitamin B-12: 167 pg/mL — ABNORMAL LOW (ref 211–911)

## 2020-02-21 LAB — VITAMIN D 25 HYDROXY (VIT D DEFICIENCY, FRACTURES): VITD: 13.82 ng/mL — ABNORMAL LOW (ref 30.00–100.00)

## 2020-02-21 LAB — LIPID PANEL
Cholesterol: 273 mg/dL — ABNORMAL HIGH (ref 0–200)
HDL: 53.3 mg/dL (ref >39.00)
LDL Cholesterol: 188 mg/dL — ABNORMAL HIGH (ref 0–99)
NonHDL: 219.48
Total CHOL/HDL Ratio: 5
Triglycerides: 157 mg/dL — ABNORMAL HIGH (ref 0.0–149.0)
VLDL: 31.4 mg/dL (ref 0.0–40.0)

## 2020-02-21 LAB — TSH: TSH: 1.57 u[IU]/mL (ref 0.35–4.50)

## 2020-02-21 LAB — HEMOGLOBIN A1C: Hgb A1c MFr Bld: 5.3 % (ref 4.6–6.5)

## 2020-02-21 MED ORDER — CITALOPRAM HYDROBROMIDE 10 MG PO TABS: 10.0000 mg | ORAL_TABLET | Freq: Every day | ORAL | 1 refills | Status: DC

## 2020-02-21 NOTE — Patient Instructions (Addendum)
It was nice to meet you today.  Please go to the lab for routine laboratory studies.  I have refilled your Celexa.  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  Your immunizations are up-to-date. Continue with your healthy lifestyle with the diet, regular exercise, dental visits every 6 months and vision checks annually.   Preventive Care 32-37 Years Old, Male Preventive care refers to lifestyle choices and visits with your health care provider that can promote health and wellness. This includes:  A yearly physical exam. This is also called an annual well check.  Regular dental and eye exams.  Immunizations.  Screening for certain conditions.  Healthy lifestyle choices, such as eating a healthy diet, getting regular exercise, not using drugs or products that contain nicotine and tobacco, and limiting alcohol use. What can I expect for my preventive care visit? Physical exam Your health care provider will check:  Height and weight. These may be used to calculate body mass index (BMI), which is a measurement that tells if you are at a healthy weight.  Heart rate and blood pressure.  Your skin for abnormal spots. Counseling Your health care provider may ask you questions about:  Alcohol, tobacco, and drug use.  Emotional well-being.  Home and relationship well-being.  Sexual activity.  Eating habits.  Work and work Statistician. What immunizations do I need?  Influenza (flu) vaccine  This is recommended every year. Tetanus, diphtheria, and pertussis (Tdap) vaccine  You may need a Td booster every 10 years. Varicella (chickenpox) vaccine  You may need this vaccine if you have not already been vaccinated. Human papillomavirus (HPV) vaccine  If recommended by your health care provider, you may need three doses over 6 months. Measles, mumps, and rubella (MMR) vaccine  You  may need at least one dose of MMR. You may also need a second dose. Meningococcal conjugate (MenACWY) vaccine  One dose is recommended if you are 71-82 years old and a Market researcher living in a residence hall, or if you have one of several medical conditions. You may also need additional booster doses. Pneumococcal conjugate (PCV13) vaccine  You may need this if you have certain conditions and were not previously vaccinated. Pneumococcal polysaccharide (PPSV23) vaccine  You may need one or two doses if you smoke cigarettes or if you have certain conditions. Hepatitis A vaccine  You may need this if you have certain conditions or if you travel or work in places where you may be exposed to hepatitis A. Hepatitis B vaccine  You may need this if you have certain conditions or if you travel or work in places where you may be exposed to hepatitis B. Haemophilus influenzae type b (Hib) vaccine  You may need this if you have certain risk factors. You may receive vaccines as individual doses or as more than one vaccine together in one shot (combination vaccines). Talk with your health care provider about the risks and benefits of combination vaccines. What tests do I need? Blood tests  Lipid and cholesterol levels. These may be checked every 5 years starting at age 47.  Hepatitis C test.  Hepatitis B test. Screening   Diabetes screening. This is done by checking your blood sugar (glucose) after you have not eaten for a while (fasting).  Sexually transmitted disease (STD) testing. Talk with your health care provider about your test results, treatment options, and if necessary, the need  for more tests. Follow these instructions at home: Eating and drinking   Eat a diet that includes fresh fruits and vegetables, whole grains, lean protein, and low-fat dairy products.  Take vitamin and mineral supplements as recommended by your health care provider.  Do not drink alcohol if  your health care provider tells you not to drink.  If you drink alcohol: ? Limit how much you have to 0-2 drinks a day. ? Be aware of how much alcohol is in your drink. In the [place] one drink equals one 12 oz bottle of beer (355 mL), one 5 oz glass of wine (148 mL), or one 1 oz glass of hard liquor (44 mL). Lifestyle  Take daily care of your teeth and gums.  Stay active. Exercise for at least 30 minutes on 5 or more days each week.  Do not use any products that contain nicotine or tobacco, such as cigarettes, e-cigarettes, and chewing tobacco. If you need help quitting, ask your health care provider.  If you are sexually active, practice safe sex. Use a condom or other form of protection to prevent STIs (sexually transmitted infections). What's next?  Go to your health care provider once a year for a well check visit.  Ask your health care provider how often you should have your eyes and teeth checked.  Stay up to date on all vaccines. This information is not intended to replace advice given to you by your health care provider. Make sure you discuss any questions you have with your health care provider. Document Revised: 07/01/2018 Document Reviewed: 07/01/2018 Elsevier Patient Education  2020 [place] With Anxiety Anxiety disorders are mental health conditions that cause overwhelming feelings of nervousness or worry. These feelings interfere with daily activities and relationships. Anxiety disorders include:  Generalized anxiety disorder (GAD).  Social anxiety.  Post-traumatic stress disorder (PTSD). When a person has an anxiety disorder, his or her condition can affect others around him or her, such as friends and family members. Friends and family can help by offering support and understanding. What do I need to know about this condition? Anxiety is the mental and physical experience of nervousness or worry that you might feel when you think about a  stressful event. Occasional anxiety is normal, but a person with an anxiety disorder becomes preoccupied with this worry. He or she may know that the anxiety is not logical, but knowing this does not relieve the discomfort that he or she feels. Anxiety disorders cause a great deal of distress and prevent someone from having a normal daily life. Someone with an anxiety disorder may:  Experience anxiety that: ? May or may not have a specific trigger. ? Lasts for long periods of time. ? Causes physical problems over time. ? Is far more intense than normal anticipation. ? Occurs at unpredictable times.  Feel restless or edgy.  Get fatigued easily.  Have trouble focusing.  Have muscle tension.  Have trouble falling asleep or staying asleep.  Be irritable and occasionally have sudden expressions of strong feelings (outbursts).  Have worries that do not make sense to you. What do I need to know about the treatment options? Anxiety disorders are generally very treatable by mental health providers such as psychologists, psychiatrists, and clinical social workers. Treatment may include one or more of the following:  Psychotherapy, also called talk therapy or counseling. Types of psychotherapy that are used to treat anxiety include: ? Cognitive behavioral therapy (CBT). This type of therapy  teaches a person how to recognize unhealthy feelings, thoughts, and behaviors, and how to replace those feelings with positive thoughts and actions. ? Behavior therapy that trains a person to relax and self-soothe. This also involves gradually exposing the person to the cause of the anxiety (progressive exposure therapy). ? Biofeedback. This type of therapy focuses on trying to control certain body functions, like heart rate, to lessen the physical impact of anxiety. ? Mindfulness-based stress reduction training. This uses education, meditation, and yoga to help a person stay focused on the present instead of  living in the past or worrying about the future. ? Acceptance and commitment therapy (ACT). This helps a person to focus on acceptance, rather than trying to control every situation. ? Family therapy. This treatment helps family members to communicate and deal with conflict in healthy ways.  Medicine to treat anxiety and help to control certain emotions and behaviors.  Mind-body programs. These programs encourage the person with anxiety to be involved in his or her treatment and feel empowered. Mind-body programs may include mindfulness-based stress reduction training, yoga, or tai chi. How can I create a safe environment?  For certain types of anxiety, such as PTSD, you may want to: ? Remove alcohol and prescription medicines from your loved one's home, or limit the amount of these substances in the home. This can help to prevent your loved one from abusing alcohol and prescription medicines. ? Remove or lock up guns and other weapons. If you do not have a safe place to keep a gun, local law enforcement may store a gun for you.  Make a written crisis plan. Include important phone numbers, such as the local crisis intervention team. Make sure that: ? The person with anxiety knows about this plan and agrees with it. ? Everyone who has regular contact with the person knows about the plan and knows what to do in an emergency. ? The written plan is easily accessible and can be quickly put into action. How should I care for myself? It is important to find ways to care for your body, mind, and well-being while supporting someone with anxiety.  Try to maintain your normal routines. This can help you remember that your life is about more than your loved one's condition.  Understand what your limits are. Say "no" to requests or events that lead to a schedule that is too busy.  Make time for activities that help you relax, and try to not feel guilty about taking time for yourself.  Spend time with  friends and family.  Consider trying meditation and deep breathing exercises to lower your stress. Attend some mind-body classes by yourself or with your loved one.  Get plenty of sleep.  Exercise, even if it is just taking a short walk a few times a week.  If you are struggling emotionally with guilt, fear, or anger, consider working with a therapist. What are some signs that the condition is getting worse? Signs that your loved one's condition may be getting worse include:  Dramatic mood swings.  Staying away from activities that he or she used to enjoy.  Drinking more alcohol than normal.  Either seeming tearful or seeming to lack emotion.  Talking about "not feeling right."  Staying away from others (isolating himself or herself). Where to find support Talk about the condition Good communication is the key to supporting your friend or family member. Here are a few things to keep in mind:  Be careful about too  much prodding. Try not to overdo reminders to an adult friend or family member about things like taking medicines. Ask how your loved one prefers that you help.  Ask questions and then listen to your loved one's response. Be available if your friend or family member wants to talk, but give your loved one space if he or she does not feel like talking.  Never ignore comments about suicide, and do not try to avoid the subject of suicide. Talking about suicide will not make your loved one want to act on it. You or your loved one can reach out 24 hours a day to get free, private support (on the phone or a live online chat) from a suicide crisis helpline, such as the [facility] at (807)317-7151.  Be encouraging and offer emotional support. This can help to lower stress. Even saying something simple to comfort your loved one may help.  If your loved one is open to it, go with him or her to visits with a counselor or health care provider. Get suggestions  directly from your loved one's care providers about when to get help if you are concerned about behavior changes. Privacy laws limit how much a person's health care provider can share with you without your loved one's permission, but if you feel that a situation is an emergency, do not wait to call a health care provider or emergency services. Find support and resources  Consider joining self-help and support groups, not only for your friend or family member, but also for yourself. People in these peer and family support groups understand what you and your loved one are going through. They can help you feel a sense of hope and connect you with local resources to help you learn more. ? You may also consider family therapy. General support  Make an effort to learn all you can about your loved one's form of anxiety.  Include your loved one in activities. Invite him or her to go for walks and outings.  Help your loved one follow his or her treatment plan as directed by health care providers. This could mean driving him or her to therapy sessions or suggesting ways to cope with stress.  Remember that your support really matters. Social support is a huge benefit for someone who is coping with anxiety. Where to find more information A health care provider may be able to recommend mental health resources that are available online or over the phone. You could start with:  Government sites such as the Substance Abuse and [place] (SAMHSA): ktimeonline.com  National mental health organizations such as the [employer] on [place] ([place]): www.nami.org Get help right away if:  Your loved one expresses thoughts about harming himself or herself or others.  Your loved one's behavior becomes hard to predict (erratic).  Your loved one shows behavior that does not make sense with the current time, place, or circumstances. These behaviors may include seeing, feeling,  tasting, or hearing things that are not real (hallucinations) or having flashbacks. If you ever feel like your loved one may hurt himself or herself or others, or may have thoughts about taking his or her own life, get help right away. You can go to your nearest emergency department or call:  Your local emergency services (911 in the [place]  A suicide crisis helpline, such as the [place] at 289-484-6458. This is open 24 hours a day. Summary  People with anxiety  disorders experience overwhelming feelings of nervousness or worry. Friends and family can help by offering support and understanding.  Anxiety disorders are generally very treatable by mental health providers. They can be treated with psychotherapy (also known as talk therapy), behavior therapy, medicine, and mind-body programs.  Be compassionate and listen to your loved one. Be available if your friend or family member wants to talk, but give your loved one space if he or she does not feel like talking.  Find ways to care for your own body, mind, and well-being while supporting someone with anxiety. Try to maintain your normal routines and make time to do things that you enjoy. This information is not intended to replace advice given to you by your health care provider. Make sure you discuss any questions you have with your health care provider. Document Revised: 10/28/2018 Document Reviewed: 11/18/2016 Elsevier Patient Education  [place].

## 2020-02-22 LAB — CELIAC DISEASE AB SCREEN W/RFX
Antigliadin Abs, IgA: 9 units (ref 0–19)
IgA/Immunoglobulin A, Serum: 520 mg/dL — ABNORMAL HIGH (ref 90–386)
Transglutaminase IgA: <2 U/mL (ref 0–3)

## 2020-02-24 ENCOUNTER — Telehealth: Payer: Self-pay | Admitting: Nurse Practitioner

## 2020-02-24 ENCOUNTER — Encounter: Payer: Self-pay | Admitting: Nurse Practitioner

## 2020-02-24 DIAGNOSIS — E785 Hyperlipidemia, unspecified: Secondary | ICD-10-CM

## 2020-02-24 DIAGNOSIS — E538 Deficiency of other specified B group vitamins: Secondary | ICD-10-CM

## 2020-02-24 DIAGNOSIS — R7989 Other specified abnormal findings of blood chemistry: Secondary | ICD-10-CM

## 2020-02-24 DIAGNOSIS — Z6828 Body mass index (BMI) 28.0-28.9, adult: Secondary | ICD-10-CM | POA: Insufficient documentation

## 2020-02-24 DIAGNOSIS — E782 Mixed hyperlipidemia: Secondary | ICD-10-CM

## 2020-02-24 DIAGNOSIS — R768 Other specified abnormal immunological findings in serum: Secondary | ICD-10-CM

## 2020-02-24 DIAGNOSIS — R197 Diarrhea, unspecified: Secondary | ICD-10-CM | POA: Insufficient documentation

## 2020-02-24 HISTORY — DX: Other specified abnormal findings of blood chemistry: R79.89

## 2020-02-24 HISTORY — DX: Hyperlipidemia, unspecified: E78.5

## 2020-02-24 NOTE — Progress Notes (Signed)
New Patient Office Visit  Subjective:  Patient ID: Brandon Murray, male    DOB: [birth year]  Age: 39 y.o. MRN: [id number]  CC:  Chief Complaint  Patient presents with  . 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 He last saw Lauren 04/13/2019.  He has concerns about diarrhea and blood in the stool that occurred after his appendectomy in May.  He has discovered that he is gluten intolerant.  Dr. Jeannine Kitten had an appendectomy in May and intraoperatively the appendix was found to have a small contained perforation.  The patient had an uncomplicated postop course.  He did notice shortly after that surgery onset of diarrhea up to 8-10 times a day.  He did not have significant abdominal pain or cramps.  He thought it was secondary to the antibiotics, but then it continued until the end of June. He also saw blood in the stool and in the commode associated with terrible pain like a hemorrhoid or a fissure.  He did use Preparation H for 2-3 nights and  it did not help much.  His mother has celiac disease so he tried eating gluten-free as a trial. Immediately,  within 24 hours, the stools became more solid.   Since he has been eating gluten-free, he has had 1-2 normal to soft stools per day.  He tried eating gluten again and had a stomach upset, loose stools and rectal bleeding in mid July.  He is back to eating gluten-free diet.  He has had no GI evaluation for the symptoms.  History of major single episode of depression associated with stress adjustment reaction: He reports depression triggered by a divorce in 2019.  He has been taking Celexa and has done very well.  He did not realize how much anxiety and social anxiety he has, so he chooses to stay on the Celexa.  He does not feel depressed any longer.  PHQ-9: 0 and GAD-7: 0.  No SI or  HI.  Immunizations:UTD Diet: healthy Exercise: Colonoscopy:No Vision: Lasik 12/20 Dental: UTD   Past Medical History:  Diagnosis Date  . Depression   . History of fractured pelvis 2010    Past Surgical History:  Procedure Laterality Date  . APPENDECTOMY    . CHOLECYSTECTOMY  2010  . LAPAROSCOPIC APPENDECTOMY N/A 12/05/2019   Procedure: APPENDECTOMY LAPAROSCOPIC;  Surgeon: Jesusita Oka, MD;  Location: [facility] OR;  Service: General;  Laterality: N/A;  . REFRACTIVE SURGERY Bilateral     Family History  Problem Relation Age of Onset  . Diabetes Father   . Diabetes Maternal Grandmother   . Cancer Maternal Grandfather   . Diabetes Maternal Grandfather   . Heart disease Paternal Grandmother     Social History   Socioeconomic History  . Marital status: Married    Spouse name: Not on file  . Number of children: Not on file  . Years of education: Not on file  . Highest education level: Not on file  Occupational History  . Occupation: MD    Employer: [employer]  Tobacco Use  . Smoking status: Never Smoker  . Smokeless tobacco: Never Used  Vaping Use  . Vaping Use: Never used  Substance and Sexual Activity  . Alcohol use: Yes    Alcohol/week: 1.0 - 2.0 standard drink  Types: 1 - 2 Cans of beer per week    Comment: drinks 1-2 beers weekly  . Drug use: Never  . Sexual activity: Not Currently  Other Topics Concern  . 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    Social Determinants of Health   Financial Resource Strain:   . Difficulty of Paying Living Expenses:   Food Insecurity:   . Worried About Charity fundraiser in the Last Year:   . Arboriculturist in the Last Year:   Transportation Needs:   . Film/video editor (Medical):   Marland Kitchen Lack of Transportation (Non-Medical):   Physical Activity:   . Days of Exercise per Week:   . Minutes of Exercise per Session:   Stress:   . Feeling of Stress :   Social  Connections:   . Frequency of Communication with Friends and Family:   . Frequency of Social Gatherings with Friends and Family:   . Attends Religious Services:   . Active Member of Clubs or Organizations:   . Attends Archivist Meetings:   Marland Kitchen Marital Status:   Intimate Partner Violence:   . Fear of Current or Ex-Partner:   . Emotionally Abused:   Marland Kitchen Physically Abused:   . Sexually Abused:     Review of Systems  Constitutional: Negative for chills and fever.  HENT: Negative.   Eyes: Negative.   Respiratory: Negative.   Cardiovascular: Negative.   Gastrointestinal: Positive for blood in stool, diarrhea and rectal pain. Negative for abdominal pain, constipation, nausea and vomiting.       Per HPI  Endocrine: Negative.   Genitourinary: Positive for difficulty urinating.  Musculoskeletal: Negative.   Allergic/Immunologic: Negative.   Neurological: Negative.   Hematological: Negative.   Psychiatric/Behavioral: Negative.        Per HPI    Objective:   Today's Vitals: BP 112/70 (BP Location: Left Arm, Patient Position: Sitting, Cuff Size: Normal)   Pulse 62   Temp 98.3 F (36.8 C) (Oral)   Ht 5\' 9"  (1.753 m)   Wt 194 lb (88 kg)   SpO2 99%   BMI 28.65 kg/m   Physical Exam Vitals reviewed.  Constitutional:      Appearance: Normal appearance.  Eyes:     Conjunctiva/sclera: Conjunctivae normal.     Pupils: Pupils are equal, round, and reactive to light.  Cardiovascular:     Rate and Rhythm: Normal rate and regular rhythm.     Pulses: Normal pulses.     Heart sounds: Normal heart sounds.  Pulmonary:     Effort: Pulmonary effort is normal.     Breath sounds: Normal breath sounds.  Abdominal:     General: There is no distension.     Palpations: Abdomen is soft.     Tenderness: There is no abdominal tenderness.     Hernia: No hernia is present.     Comments: Healed appendectomy scars   Musculoskeletal:        General: Normal range of motion.     Cervical  back: Normal range of motion.  Skin:    General: Skin is warm and dry.  Neurological:     General: No focal deficit present.     Mental Status: He is alert and oriented to person, place, and time.  Psychiatric:        Mood and Affect: Mood normal.        Behavior:  Behavior normal.        Thought Content: Thought content normal.        Judgment: Judgment normal.     Assessment & Plan:   Problem List Items Addressed This Visit      Digestive   Rectal bleeding - Primary   Relevant Orders   CBC with Differential/Platelet (Completed)   Ambulatory referral to Gastroenterology     Endocrine   Male pattern baldness     Other   Depression, major, single episode, moderate (HCC)   Relevant Medications   citalopram (CELEXA) 10 MG tablet   Encounter for routine history and physical exam for male   Gluten intolerance   Relevant Orders   Celiac Disease Ab Screen w/Rfx (Completed)   Ambulatory referral to Gastroenterology   BMI 28.0-28.9,adult   Relevant Orders   TSH (Completed)   Comprehensive metabolic panel (Completed)   Hemoglobin A1c (Completed)   Lipid panel (Completed)   Diarrhea      Outpatient Encounter Medications as of 02/21/2020  Medication Sig  . citalopram (CELEXA) 10 MG tablet Take 1 tablet (10 mg total) by mouth daily.  . finasteride (PROPECIA) 1 MG tablet TAKE 1 TABLET BY MOUTH DAILY. (Patient taking differently: Take 1 mg by mouth daily. )  . [DISCONTINUED] acetaminophen (TYLENOL) 500 MG tablet Take 2 tablets (1,000 mg total) by mouth every 6 (six) hours.  . [DISCONTINUED] citalopram (CELEXA) 10 MG tablet TAKE 1 TABLET BY MOUTH DAILY.  . [DISCONTINUED] ibuprofen (MOTRIN IB) 200 MG tablet Take 2-3 tablets (400-600 mg total) by mouth every 8 (eight) hours as needed.   No facility-administered encounter medications on file as of 02/21/2020.   Also presents for concerns about episode of diarrhea with no blood per rectum no seeming to improve with gluten-free diet.   He has not had stool studies however the diarrhea has resolved with gluten-free diet.  We do recommend GI consultation and he is in agreement.  He is in need of routine CPE laboratory studies. He is up-to-date on his immunizations.  He has had Covid vaccines as well.   I have refilled your Celexa. Continue with finasteride 1 mg daily.  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  Your immunizations are up-to-date. Continue with your healthy lifestyle with the diet, regular exercise, dental visits every 6 months and vision checks annually.  Follow-up pending laboratory findings.  Recommend annual CPE.  Laboratory addendum 02/23/2020: He has a normal CBC, thyroid,  Cmet, A1C 1. Celiac panel shows negative serology for celiac.  It did not show a low IgA level which would make the test not accurate.  It did show a mildly elevated IgA at 520.  An isolated elevated IgA is not uncommon when we check a celiac panel.  I will recommend an immunoglobulin G-A-M-E panel in 3 mos to check for other elevated immunoglobulins.  Having one isolated immunoglobulin is not significant.  If he has multiple elevated immunoglobulins, then I would recommend hematology referral.    2. HLD: Not sure if he was fasting: Cholesterol 273, triglycerides 157, LDL 188, HDL is good at 53.  Recommendation is diet management and exercise at this time.  I would repeat a fasting lipid panel in 3 months.  If LDL is greater than 190, that would trigger an evaluation for hereditary hyperlipidemia.  3.  Low vitamin D at 13.82.  Per up-to-date: He can have 1000 IU  vitamin D3 daily or if it would be more convenient for him, I will order at 50,000 IU once a week for 8 week.  Recheck in 3 months  Follow-up: Return in about 1 year (around 02/20/2021).  This visit occurred during the SARS-CoV-2 public health emergency.  Safety protocols were in place,  including screening questions prior to the visit, additional usage of staff PPE, and extensive cleaning of exam room while observing appropriate contact time as indicated for disinfecting solutions.   Denice Paradise, NP

## 2020-02-24 NOTE — Telephone Encounter (Signed)
I sent him  MyChart message to call me about labs. I will send the info below to his My Chart. No action for you, but if he calls, you can relay this info below. I will f/up on Tues to place orders or to call him myself  if he has not responded.   He has a normal CBC, thyroid,  Cmet, A1C  1. Celiac panel shows negative serology for celiac.  It did not show a low IgA level which would make the test not accurate.  It did show a mildly elevated IgA at 520.  An isolated elevated IgA is not uncommon when we check a celiac panel.  I will recommend an immunoglobulin G-A-M-E panel in 3 mos to check for other elevated immunoglobulins.  Having one isolated immunoglobulin is not significant.  If he has multiple elevated immunoglobulins, then I would recommend hematology referral.    2. HLD: Not sure if he was fasting: Cholesterol 273, triglycerides 157, LDL 188, HDL is good at 53.  Recommendation is diet management and exercise at this time.  I would repeat a fasting lipid panel in 3 months.  If LDL is greater than 190, that would trigger an evaluation for hereditary hyperlipidemia.  3.  Low vitamin D at 13.82.  Per up-to-date: He can have 1000 IU vitamin D3 daily or if it would be more convenient for him, I will order at 50,000 IU once a week for 8 week.  Recheck in 3 months .

## 2020-02-24 NOTE — Assessment & Plan Note (Signed)
GI referral placed. No active bleeding now.

## 2020-02-27 NOTE — Telephone Encounter (Signed)
Patient did read the mychart message that you sent him but he did not call today nor did he respond yet thru mychart. - just an FYI for you in case you want to call him or me to call him tomorrow.

## 2020-02-28 NOTE — Telephone Encounter (Signed)
Spoke with patient and he scheduled fasting lab appt for 05/30/20 at 8:15 am. He did not have any questions or concerns about his labs.

## 2020-02-28 NOTE — Telephone Encounter (Signed)
Yes, please call him.  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

## 2020-03-12 NOTE — Telephone Encounter (Signed)
Pt wanted a call back to discuss the vit D med

## 2020-03-12 NOTE — Telephone Encounter (Signed)
LMTCB

## 2020-03-13 MED ORDER — VITAMIN D (ERGOCALCIFEROL) 1.25 MG (50000 UNIT) PO CAPS: 50000.0000 [IU] | ORAL_CAPSULE | ORAL | 0 refills | Status: DC

## 2020-03-13 NOTE — Addendum Note (Signed)
Addended by: Denice Paradise A on: 03/13/2020 03:18 PM   Modules accepted: Orders

## 2020-03-13 NOTE — Telephone Encounter (Addendum)
1. I ordered Vit D 50, 000 IU every 7 days for 8 weeks. When complete start taking Vit D 3 at 1000 IU daily.   2. He has  low B12: 167 and this must be supplemented.   PLAN: We can do OTC B12 1000 mcg daily- OR  he can start IM injections weekly x 4 and monthly x 3 to get his levels up quicker. Whichever he prefers. Let me know.   3. Fasting labs in 11/102021 at any [facility] lab: Lipids, GAME, Vit D, B12/folate, intrinsic factor, bmet.  4. Video visit w me to discuss lab  results in Nov.

## 2020-03-13 NOTE — Telephone Encounter (Signed)
Patient has decided that he would rather take the prescription strength of Vit D instead of OTC. Please send in rx for patient.

## 2020-03-13 NOTE — Telephone Encounter (Signed)
Sent patient reply thru mychart that his rx was ordered and also asked if about the vit b12 injections vs oral supp.

## 2020-03-14 MED FILL — VIT D2 1.25 MG (50,000 UNIT: 1.25 MG | 56 days supply | Qty: 8 | Fill #0

## 2020-04-24 ENCOUNTER — Other Ambulatory Visit: Payer: Self-pay | Admitting: Family Medicine

## 2020-04-24 DIAGNOSIS — L649 Androgenic alopecia, unspecified: Secondary | ICD-10-CM

## 2020-04-24 MED FILL — FINASTERIDE 1 MG TABLET: 1 | 90 days supply | Qty: 90 | Fill #0

## 2020-04-24 MED FILL — CITALOPRAM HBR 10 MG TABLET: 10 | 90 days supply | Qty: 90 | Fill #0

## 2020-05-29 DIAGNOSIS — R768 Other specified abnormal immunological findings in serum: Secondary | ICD-10-CM | POA: Diagnosis not present

## 2020-05-29 DIAGNOSIS — E538 Deficiency of other specified B group vitamins: Secondary | ICD-10-CM | POA: Diagnosis not present

## 2020-05-30 ENCOUNTER — Other Ambulatory Visit (INDEPENDENT_AMBULATORY_CARE_PROVIDER_SITE_OTHER): Payer: 59

## 2020-05-30 ENCOUNTER — Other Ambulatory Visit: Payer: Self-pay

## 2020-05-30 DIAGNOSIS — R7989 Other specified abnormal findings of blood chemistry: Secondary | ICD-10-CM

## 2020-05-30 DIAGNOSIS — E538 Deficiency of other specified B group vitamins: Secondary | ICD-10-CM

## 2020-05-30 DIAGNOSIS — R768 Other specified abnormal immunological findings in serum: Secondary | ICD-10-CM

## 2020-05-30 DIAGNOSIS — E782 Mixed hyperlipidemia: Secondary | ICD-10-CM

## 2020-05-30 DIAGNOSIS — R3121 Asymptomatic microscopic hematuria: Secondary | ICD-10-CM

## 2020-05-30 LAB — LIPID PANEL
Cholesterol: 235 mg/dL — ABNORMAL HIGH (ref 0–200)
HDL: 56.4 mg/dL (ref >39.00)
LDL Cholesterol: 154 mg/dL — ABNORMAL HIGH (ref 0–99)
NonHDL: 178.55
Total CHOL/HDL Ratio: 4
Triglycerides: 123 mg/dL (ref 0.0–149.0)
VLDL: 24.6 mg/dL (ref 0.0–40.0)

## 2020-05-30 LAB — VITAMIN D 25 HYDROXY (VIT D DEFICIENCY, FRACTURES): VITD: 22.62 ng/mL — ABNORMAL LOW (ref 30.00–100.00)

## 2020-05-30 LAB — B12 AND FOLATE PANEL
Folate: 18.3 ng/mL (ref >5.9)
Vitamin B-12: 1403 pg/mL — ABNORMAL HIGH (ref 211–911)

## 2020-05-30 NOTE — Addendum Note (Signed)
Addended by: Tor Netters I on: 05/30/2020 08:02 AM   Modules accepted: Orders

## 2020-05-30 NOTE — Addendum Note (Signed)
Addended by: Tor Netters I on: 05/30/2020 08:01 AM   Modules accepted: Orders

## 2020-06-02 LAB — IMMUNOGLOBULINS A/E/G/M, SERUM
IgA/Immunoglobulin A, Serum: 452 mg/dL — ABNORMAL HIGH (ref 90–386)
IgE (Immunoglobulin E), Serum: 44 IU/mL (ref 6–495)
IgG (Immunoglobin G), Serum: 1069 mg/dL (ref 603–1613)
IgM (Immunoglobulin M), Srm: 67 mg/dL (ref 20–172)

## 2020-06-02 LAB — INTRINSIC FACTOR ANTIBODIES: Intrinsic Factor Abs, Serum: 1 AU/mL (ref 0.0–1.1)

## 2020-06-03 NOTE — Addendum Note (Signed)
Addended by: Denice Paradise A on: 06/03/2020 09:44 PM   Modules accepted: Orders

## 2020-06-07 NOTE — Addendum Note (Signed)
Addended by: Denice Paradise A on: 06/07/2020 03:11 PM   Modules accepted: Orders

## 2020-06-07 NOTE — Progress Notes (Signed)
1. I placed referral in to HEMATOLOGY for elevated IgA.  2. Recommend a repeat UA to check for hematuria. I placed order at [facility] and you can get it there.  3. The intrinsic factor is normal.

## 2020-06-11 ENCOUNTER — Encounter: Payer: Self-pay | Admitting: Nurse Practitioner

## 2020-06-19 ENCOUNTER — Encounter: Payer: Self-pay | Admitting: Internal Medicine

## 2020-06-19 ENCOUNTER — Other Ambulatory Visit: Payer: Self-pay | Admitting: Internal Medicine

## 2020-06-19 ENCOUNTER — Inpatient Hospital Stay: Payer: 59 | Attending: Internal Medicine | Admitting: Internal Medicine

## 2020-06-19 ENCOUNTER — Inpatient Hospital Stay: Payer: 59

## 2020-06-19 DIAGNOSIS — Z809 Family history of malignant neoplasm, unspecified: Secondary | ICD-10-CM | POA: Insufficient documentation

## 2020-06-19 DIAGNOSIS — Z79899 Other long term (current) drug therapy: Secondary | ICD-10-CM | POA: Insufficient documentation

## 2020-06-19 DIAGNOSIS — R768 Other specified abnormal immunological findings in serum: Secondary | ICD-10-CM

## 2020-06-19 DIAGNOSIS — F32A Depression, unspecified: Secondary | ICD-10-CM | POA: Insufficient documentation

## 2020-06-19 DIAGNOSIS — Z9049 Acquired absence of other specified parts of digestive tract: Secondary | ICD-10-CM | POA: Diagnosis not present

## 2020-06-19 DIAGNOSIS — Z833 Family history of diabetes mellitus: Secondary | ICD-10-CM | POA: Diagnosis not present

## 2020-06-19 DIAGNOSIS — K529 Noninfective gastroenteritis and colitis, unspecified: Secondary | ICD-10-CM | POA: Diagnosis not present

## 2020-06-19 DIAGNOSIS — R3129 Other microscopic hematuria: Secondary | ICD-10-CM | POA: Diagnosis not present

## 2020-06-19 DIAGNOSIS — E785 Hyperlipidemia, unspecified: Secondary | ICD-10-CM | POA: Insufficient documentation

## 2020-06-19 DIAGNOSIS — Z8249 Family history of ischemic heart disease and other diseases of the circulatory system: Secondary | ICD-10-CM | POA: Insufficient documentation

## 2020-06-19 LAB — CBC WITH DIFFERENTIAL/PLATELET
Abs Immature Granulocytes: 0.04 10*3/uL (ref 0.00–0.07)
Basophils Absolute: 0.1 10*3/uL (ref 0.0–0.1)
Basophils Relative: 1 %
Eosinophils Absolute: 0.2 10*3/uL (ref 0.0–0.5)
Eosinophils Relative: 3 %
HCT: 43.5 % (ref 39.0–52.0)
Hemoglobin: 14.7 g/dL (ref 13.0–17.0)
Immature Granulocytes: 1 %
Lymphocytes Relative: 30 %
Lymphs Abs: 2.1 10*3/uL (ref 0.7–4.0)
MCH: 28.6 pg (ref 26.0–34.0)
MCHC: 33.8 g/dL (ref 30.0–36.0)
MCV: 84.6 fL (ref 80.0–100.0)
Monocytes Absolute: 0.7 10*3/uL (ref 0.1–1.0)
Monocytes Relative: 10 %
Neutro Abs: 3.8 10*3/uL (ref 1.7–7.7)
Neutrophils Relative %: 55 %
Platelets: 341 10*3/uL (ref 150–400)
RBC: 5.14 MIL/uL (ref 4.22–5.81)
RDW: 12.8 % (ref 11.5–15.5)
WBC: 6.9 10*3/uL (ref 4.0–10.5)
nRBC: 0 % (ref 0.0–0.2)

## 2020-06-19 LAB — URINALYSIS, COMPLETE (UACMP) WITH MICROSCOPIC
Bacteria, UA: NONE SEEN
Bilirubin Urine: NEGATIVE
Glucose, UA: NEGATIVE mg/dL
Hgb urine dipstick: NEGATIVE
Ketones, ur: NEGATIVE mg/dL
Leukocytes,Ua: NEGATIVE
Nitrite: NEGATIVE
Protein, ur: NEGATIVE mg/dL
Specific Gravity, Urine: 1.018 (ref 1.005–1.030)
Squamous Epithelial / HPF: NONE SEEN (ref 0–5)
WBC, UA: NONE SEEN WBC/hpf (ref 0–5)
pH: 7 (ref 5.0–8.0)

## 2020-06-19 LAB — COMPREHENSIVE METABOLIC PANEL
ALT: 25 U/L (ref 0–44)
AST: 25 U/L (ref 15–41)
Albumin: 4.5 g/dL (ref 3.5–5.0)
Alkaline Phosphatase: 64 U/L (ref 38–126)
Anion gap: 4 — ABNORMAL LOW (ref 5–15)
BUN: 11 mg/dL (ref 6–20)
CO2: 34 mmol/L — ABNORMAL HIGH (ref 22–32)
Calcium: 9.5 mg/dL (ref 8.9–10.3)
Chloride: 98 mmol/L (ref 98–111)
Creatinine, Ser: 0.9 mg/dL (ref 0.61–1.24)
GFR, Estimated: >60 mL/min (ref >60)
Glucose, Bld: 95 mg/dL (ref 70–99)
Potassium: 4.1 mmol/L (ref 3.5–5.1)
Sodium: 136 mmol/L (ref 135–145)
Total Bilirubin: 1.2 mg/dL (ref 0.3–1.2)
Total Protein: 8 g/dL (ref 6.5–8.1)

## 2020-06-19 NOTE — Assessment & Plan Note (Addendum)
#  Elevated IgA-immunoglobulin; August 2021-CBC chemistries normal.  Low suspicion for any monoclonal gammopathy.  Recommend monoclonal work-up myeloma panel/kappa lambda light chain.  #Microscopic hematuria/elevated IgA levels-normal renal function.  CT abdomen pelvis May 2021-negative for any abdominal pelvis etiology of hematuria.  Check urine microalbumin.  Might need further work-up with urology/nephrology.   #Chronic diarrhea-question gluten related.  Defer to GI; awaiting appointment Jan 2022.  Thank you, Ms.Kim Jerelene Redden for allowing me to participate in the care of your pleasant patient. Please do not hesitate to contact me with questions or concerns in the interim.  # DISPOSITION: # labs to today # follow up TBD- Dr.B

## 2020-06-19 NOTE — Progress Notes (Signed)
[place] NOTE  Patient Care Team: Marval Regal, NP as PCP - General (Nurse Practitioner)  CHIEF COMPLAINTS/PURPOSE OF CONSULTATION: ELEVATED IMMUNOGLOBULINS  # OCT 2021- ELEVATED IMMUNOGLOBULIN A [480+]-  # OCT 2021- low B12/ with Normal Hb; on sublingual B12. Oncology History   No history exists.     HISTORY OF PRESENTING ILLNESS:  Brandon Murray 39 y.o.  male with no significant past medical history except for chronic diarrhea is currently referred to [place] for further work-up of elevated immunoglobulin IgA.  Patient states to have chronic diarrhea secondary to his gallbladder surgery many years ago.  However his symptoms got worse after recent appendectomy in May 2021.  He had loose stools up to 10 a day.  He has cut down significantly on gluten.  And noted to have improvement of his diarrhea-like symptoms.  However as part of the work-up noted to have elevated IgA levels.  Patient denies any back pain.  Denies any neuropathy-like symptoms.  Appetite is good but no weight loss.  Of note patient noted to have chronic microscopic hematuria on routine UA.  Denies any leg swelling.   Review of Systems  Constitutional: Negative for chills, diaphoresis, fever, malaise/fatigue and weight loss.  HENT: Negative for nosebleeds and sore throat.   Eyes: Negative for double vision.  Respiratory: Negative for cough, hemoptysis, sputum production, shortness of breath and wheezing.   Cardiovascular: Negative for chest pain, palpitations, orthopnea and leg swelling.  Gastrointestinal: Negative for abdominal pain, blood in stool, constipation, diarrhea, heartburn, melena, nausea and vomiting.  Genitourinary: Negative for dysuria, frequency and urgency.  Musculoskeletal: Negative for back pain and joint pain.  Skin: Negative.  Negative for itching and rash.  Neurological: Negative for dizziness, tingling, focal weakness, weakness and headaches.  Endo/Heme/Allergies: Does  not bruise/bleed easily.  Psychiatric/Behavioral: Negative for depression. The patient is not nervous/anxious and does not have insomnia.      MEDICAL HISTORY:  Past Medical History:  Diagnosis Date  . Acute appendicitis 12/05/2019  . Depression   . History of fractured pelvis 2010  . HLD (hyperlipidemia) 02/24/2020  . Low vitamin D level 02/24/2020  . Stress and adjustment reaction 03/17/2018    SURGICAL HISTORY: Past Surgical History:  Procedure Laterality Date  . APPENDECTOMY    . CHOLECYSTECTOMY  2010  . LAPAROSCOPIC APPENDECTOMY N/A 12/05/2019   Procedure: APPENDECTOMY LAPAROSCOPIC;  Surgeon: Jesusita Oka, MD;  Location: [facility] OR;  Service: General;  Laterality: N/A;  . REFRACTIVE SURGERY Bilateral     SOCIAL HISTORY: Social History   Socioeconomic History  . Marital status: Married    Spouse name: Not on file  . Number of children: Not on file  . Years of education: Not on file  . Highest education level: Not on file  Occupational History  . Occupation: MD    Employer: [employer]  Tobacco Use  . Smoking status: Never Smoker  . Smokeless tobacco: Never Used  Vaping Use  . Vaping Use: Never used  Substance and Sexual Activity  . Alcohol use: Yes    Alcohol/week: 1.0 - 2.0 standard drink    Types: 1 - 2 Cans of beer per week    Comment: drinks 1-2 beers weekly  . Drug use: Never  . Sexual activity: Not Currently  Other Topics Concern  . 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 No smoking. No alcohol abuse; live  [place].     Social Determinants of Health   Financial Resource Strain:   . Difficulty of Paying Living Expenses: Not on file  Food Insecurity:   . Worried About Charity fundraiser in the Last Year: Not on file  . Ran Out of Food in the Last Year: Not on file  Transportation Needs:   . Lack of Transportation (Medical): Not on file  . Lack of Transportation (Non-Medical): Not  on file  Physical Activity:   . Days of Exercise per Week: Not on file  . Minutes of Exercise per Session: Not on file  Stress:   . Feeling of Stress : Not on file  Social Connections:   . Frequency of Communication with Friends and Family: Not on file  . Frequency of Social Gatherings with Friends and Family: Not on file  . Attends Religious Services: Not on file  . Active Member of Clubs or Organizations: Not on file  . Attends Archivist Meetings: Not on file  . Marital Status: Not on file  Intimate Partner Violence:   . Fear of Current or Ex-Partner: Not on file  . Emotionally Abused: Not on file  . Physically Abused: Not on file  . Sexually Abused: Not on file    FAMILY HISTORY: Family History  Problem Relation Age of Onset  . Diabetes Father   . Diabetes Maternal Grandmother   . Cancer Maternal Grandfather   . Diabetes Maternal Grandfather   . Heart disease Paternal Grandmother     ALLERGIES:  has No Known Allergies.  MEDICATIONS:  Current Outpatient Medications  Medication Sig Dispense Refill  . cholecalciferol (VITAMIN D3) 25 MCG (1000 UNIT) tablet Take 2,000 Units by mouth daily.    . citalopram (CELEXA) 10 MG tablet Take 1 tablet (10 mg total) by mouth daily. 90 tablet 1  . cyanocobalamin 2000 MCG tablet Take 5,000 mcg by mouth daily.    . finasteride (PROPECIA) 1 MG tablet TAKE 1 TABLET BY MOUTH DAILY. 90 tablet 1   No current facility-administered medications for this visit.      Marland Kitchen  PHYSICAL EXAMINATION: ECOG PERFORMANCE STATUS: 0 - Asymptomatic  Vitals:   06/19/20 1004  BP: 139/86  Pulse: 61  Resp: 16  Temp: (!) 96.8 F (36 C)  SpO2: 99%   Filed Weights   06/19/20 1004  Weight: 202 lb 12.8 oz (92 kg)    Physical Exam HENT:     Head: Normocephalic and atraumatic.     Mouth/Throat:     Pharynx: No oropharyngeal exudate.  Eyes:     Pupils: Pupils are equal, round, and reactive to light.  Cardiovascular:     Rate and Rhythm:  Normal rate and regular rhythm.  Pulmonary:     Effort: Pulmonary effort is normal. No respiratory distress.     Breath sounds: Normal breath sounds. No wheezing.  Abdominal:     General: Bowel sounds are normal. There is no distension.     Palpations: Abdomen is soft. There is no mass.     Tenderness: There is no abdominal tenderness. There is no guarding or rebound.  Musculoskeletal:        General: No tenderness. Normal range of motion.     Cervical back: Normal range of motion and neck supple.  Skin:    General: Skin is warm.  Neurological:     Mental Status: He is alert and oriented to person, place, and time.  Psychiatric:  Mood and Affect: Affect normal.      LABORATORY DATA:  I have reviewed the data as listed Lab Results  Component Value Date   WBC 6.1 02/21/2020   HGB 14.4 02/21/2020   HCT 42.8 02/21/2020   MCV 85.3 02/21/2020   PLT 321.0 02/21/2020   Recent Labs    12/05/19 0721 12/06/19 0516 02/21/20 1032  NA 136 134* 135  K 4.0 4.6 4.6  CL 102 100 101  CO2 22 22 29   GLUCOSE 127* 155* 91  BUN 6 6 8   CREATININE 1.16 1.04 1.01  CALCIUM 9.2 8.2* 10.0  GFRNONAA >60 >60  --   GFRAA >60 >60  --   PROT 7.3  --  7.5  ALBUMIN 4.0  --  4.6  AST 16  --  28  ALT 18  --  17  ALKPHOS 81  --  85  BILITOT 1.5*  --  0.8    RADIOGRAPHIC STUDIES: I have personally reviewed the radiological images as listed and agreed with the findings in the report. No results found.  ASSESSMENT & PLAN:   Elevated immunoglobulin A #Elevated IgA-immunoglobulin; August 2021-CBC chemistries normal.  Low suspicion for any monoclonal gammopathy.  Recommend monoclonal work-up myeloma panel/kappa lambda light chain.  #Microscopic hematuria/elevated IgA levels-normal renal function.  CT abdomen pelvis May 2021-negative for any abdominal pelvis etiology of hematuria.  Check urine microalbumin.  Might need further work-up with urology/nephrology.   #Chronic diarrhea-question  gluten related.  Defer to GI; awaiting appointment Jan 2022.  Thank you, Ms.Kim Jerelene Redden for allowing me to participate in the care of your pleasant patient. Please do not hesitate to contact me with questions or concerns in the interim.  # DISPOSITION: # labs to today # follow up TBD- Dr.B  All questions were answered. The patient knows to call the clinic with any problems, questions or concerns.    Cammie Sickle, MD 06/19/2020 10:57 AM

## 2020-06-20 LAB — KAPPA/LAMBDA LIGHT CHAINS
Kappa free light chain: 16.2 mg/L (ref 3.3–19.4)
Kappa, lambda light chain ratio: 1.45 (ref 0.26–1.65)
Lambda free light chains: 11.2 mg/L (ref 5.7–26.3)

## 2020-06-21 LAB — MULTIPLE MYELOMA PANEL, SERUM
Albumin SerPl Elph-Mcnc: 3.9 g/dL (ref 2.9–4.4)
Albumin/Glob SerPl: 1.3 (ref 0.7–1.7)
Alpha 1: 0.2 g/dL (ref 0.0–0.4)
Alpha2 Glob SerPl Elph-Mcnc: 0.6 g/dL (ref 0.4–1.0)
B-Globulin SerPl Elph-Mcnc: 1.4 g/dL — ABNORMAL HIGH (ref 0.7–1.3)
Gamma Glob SerPl Elph-Mcnc: 1 g/dL (ref 0.4–1.8)
Globulin, Total: 3.2 g/dL (ref 2.2–3.9)
IgA: 461 mg/dL — ABNORMAL HIGH (ref 90–386)
IgG (Immunoglobin G), Serum: 1053 mg/dL (ref 603–1613)
IgM (Immunoglobulin M), Srm: 62 mg/dL (ref 20–172)
Total Protein ELP: 7.1 g/dL (ref 6.0–8.5)

## 2020-06-25 ENCOUNTER — Other Ambulatory Visit: Payer: Self-pay

## 2020-06-25 DIAGNOSIS — R768 Other specified abnormal immunological findings in serum: Secondary | ICD-10-CM

## 2020-06-25 LAB — PROTEIN, URINE, 24 HOUR
Collection Interval-UPROT: 24 hours
Protein, Urine: <6 mg/dL
Urine Total Volume-UPROT: 1900 mL

## 2020-06-26 ENCOUNTER — Telehealth: Payer: Self-pay | Admitting: Internal Medicine

## 2020-06-26 NOTE — Telephone Encounter (Signed)
On 12/06-left voicemail for the patient reviewing the results of the blood work/urinalysis.  UA negative for blood and protein.  IgA polyclonal-likely reactive.  Given the absence of any monoclonal protein-would not recommend any further hematologic work-up.  No follow-up with [place] recommend at this time.  Patient to call us if any questions.

## 2020-07-26 ENCOUNTER — Other Ambulatory Visit: Payer: Self-pay | Admitting: Nurse Practitioner

## 2020-07-26 ENCOUNTER — Ambulatory Visit (INDEPENDENT_AMBULATORY_CARE_PROVIDER_SITE_OTHER): Payer: 59 | Admitting: Nurse Practitioner

## 2020-07-26 ENCOUNTER — Encounter: Payer: Self-pay | Admitting: Nurse Practitioner

## 2020-07-26 VITALS — BP 124/88 | HR 60 | Ht 68.5 in | Wt 200.5 lb

## 2020-07-26 DIAGNOSIS — K625 Hemorrhage of anus and rectum: Secondary | ICD-10-CM

## 2020-07-26 MED ORDER — PLENVU 140 G PO SOLR: 1.0000 | ORAL | 0 refills | Status: DC

## 2020-07-26 NOTE — Patient Instructions (Signed)
If you are age 38 or older, your body mass index should be between 23-30. Your Body mass index is 30.04 kg/m. If this is out of the aforementioned range listed, please consider follow up with your Primary Care Provider.  If you are age 69 or younger, your body mass index should be between 19-25. Your Body mass index is 30.04 kg/m. If this is out of the aformentioned range listed, please consider follow up with your Primary Care Provider.   You have been scheduled for a colonoscopy. Please follow written instructions given to you at your visit today.  Please pick up your prep supplies at the pharmacy within the next 1-3 days. If you use inhalers (even only as needed), please bring them with you on the day of your procedure.  Follow up pending the results of your Colonoscopy or as needed.  Thank you for entrusting me with your care and choosing [facility].  Willette Cluster, NP-C

## 2020-07-26 NOTE — Progress Notes (Signed)
ASSESSMENT AND PLAN    # 40 yo male with chronic loose stool and occasional rectal bleeding. BMs now normal since elimination of gluten from diet several months ago though he continues to have episodes of rectal bleeding.  --Bleeding probably hemorrhoidal but need to rule out colitis, polyps, neoplasm, etc. Patient will be scheduled for colonoscopy.   The risks and benefits of colonoscopy with possible polypectomy / biopsies were discussed and the patient agrees to proceed.  --We discussed that celiac studies may be inaccurate when done in setting of gluten avoidance. Happy to repeat labs but he understandably doesn't want to reintroduce gluten back into his diet.    # Hx of Vitamin B12 deficiency, on supplementation.   # Hx of acute appendicitis s/p appendectomy in 2021. No evidence for ileitis on CT scan. Path c/w acute appendicitis with serositis.   HISTORY OF PRESENT ILLNESS     Primary Gastroenterologist : new- Dr. Orvan Falconer Chief Complaint : rectal bleeding  Brandon Murray is a 40 y.o. 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 Following a cholecystectomy at age 38 Brandon Murray started having loose stools about three times a day, especially after consumption of greasy food. He sometimes had rectal bleeding with BMs. This bowel habit pattern became his new norm.  Then in May of 2021 he was diagnosed with acute appendicitis. Intraoperatively the appendix was found to have a small contained perforation.  Path specimen c/w acute appendicitis with serositis.  Following the appendectomy patient began having worsening diarrhea with blood which lasted several weeks. He initially thought diarrhea was antibiotic related. .Patient's mother has gluten intolerance so patient decided to stop eating gluten and his bowel movements significantly improved. Labs for  celiac disease were negative but he was avoiding gluten at the time.  His total IgA was noted to be elevated while undergoing workup for celiac labs.  He was evaluated by Hematology, suspicion for any monoclonal gammopathy was low. Even though stool consistency has been normal off gluten for over 6 months. He continues to have occasional episodes of rectal bleeding. No primary relatives with colon cancer but maternal uncle had colon cancer in his 57's.    Data Reviewed:  06/19/20 LFTs normal.  CBC normal.      Ref. Range 02/21/2020 10:32  Transglutaminase IgA Latest Ref Range: 0 - 3 U/mL <2  Antigliadin Abs, IgA Latest Ref Range: 0 - 19 units 9    Previous Endoscopic Evaluations / Pertinent Studies: none   Past Medical History:  Diagnosis Date  . Acute appendicitis 12/05/2019  . Depression   . Gallstones   . History of fractured pelvis 2010  . HLD (hyperlipidemia) 02/24/2020  . Low vitamin D level 02/24/2020  . Stress and adjustment reaction 03/17/2018     Past Surgical History:  Procedure Laterality Date  . APPENDECTOMY    . CHOLECYSTECTOMY  2010  . LAPAROSCOPIC APPENDECTOMY N/A 12/05/2019   Procedure: APPENDECTOMY LAPAROSCOPIC;  Surgeon: Diamantina Monks, MD;  Location: [facility] OR;  Service: General;  Laterality: N/A;  . REFRACTIVE SURGERY Bilateral    Family History  Problem Relation Age of Onset  . Diabetes Father   . Heart disease Father   . Hyperlipidemia Father   . Diabetes Maternal Grandmother   . Cancer Maternal Grandfather   . Heart disease Paternal Grandmother   . Diabetes Paternal Grandmother   .  Celiac disease Mother   . Colon cancer Maternal Uncle        54's   Social History   Tobacco Use  . Smoking status: Never Smoker  . Smokeless tobacco: Never Used  Vaping Use  . Vaping Use: Never used  Substance Use Topics  . Alcohol use: Yes    Alcohol/week: 1.0 - 2.0 standard drink    Types: 1 - 2 Cans of beer per week    Comment: drinks 1-2 beers weekly  . Drug  use: Never   Current Outpatient Medications  Medication Sig Dispense Refill  . cholecalciferol (VITAMIN D3) 25 MCG (1000 UNIT) tablet Take 2,000 Units by mouth daily.    . citalopram (CELEXA) 10 MG tablet Take 1 tablet (10 mg total) by mouth daily. 90 tablet 1  . cyanocobalamin 2000 MCG tablet Take 5,000 mcg by mouth daily.    . finasteride (PROPECIA) 1 MG tablet TAKE 1 TABLET BY MOUTH DAILY. 90 tablet 1   No current facility-administered medications for this visit.   No Known Allergies   Review of Systems: All  systems reviewed and negative except where noted in HPI.   PHYSICAL EXAM :    Wt Readings from Last 3 Encounters:  07/26/20 200 lb 8 oz (90.9 kg)  06/19/20 202 lb 12.8 oz (92 kg)  02/21/20 194 lb (88 kg)    BP 124/88 (BP Location: Left Arm, Patient Position: Sitting, Cuff Size: Normal)   Pulse 60   Ht 5' 8.5" (1.74 m) Comment: height measured without shoes  Wt 200 lb 8 oz (90.9 kg)   BMI 30.04 kg/m  Constitutional:  Pleasant male in no acute distress. Psychiatric: Normal mood and affect. Behavior is normal. EENT: Pupils normal.  Conjunctivae are normal. No scleral icterus. Neck supple.  Cardiovascular: Normal rate, regular rhythm. No edema Pulmonary/chest: Effort normal and breath sounds normal. No wheezing, rales or rhonchi. Abdominal: Soft, nondistended, nontender. Bowel sounds active throughout. There are no masses palpable. No hepatomegaly. Neurological: Alert and oriented to person place and time. Skin: Skin is warm and dry. No rashes noted.  Tye Savoy, NP  07/26/2020, 3:46 PM  Cc:  Referring Provider Marval Regal, NP

## 2020-07-27 ENCOUNTER — Encounter: Payer: Self-pay | Admitting: Nurse Practitioner

## 2020-08-01 NOTE — Progress Notes (Signed)
Reviewed.  Adaeze Better L. Javius Sylla, MD, MPH  

## 2020-08-20 DIAGNOSIS — H52223 Regular astigmatism, bilateral: Secondary | ICD-10-CM | POA: Diagnosis not present

## 2020-08-24 ENCOUNTER — Encounter: Payer: Self-pay | Admitting: Gastroenterology

## 2020-08-30 ENCOUNTER — Ambulatory Visit (AMBULATORY_SURGERY_CENTER): Payer: 59 | Admitting: Gastroenterology

## 2020-08-30 ENCOUNTER — Other Ambulatory Visit: Payer: Self-pay

## 2020-08-30 ENCOUNTER — Other Ambulatory Visit: Payer: Self-pay | Admitting: Gastroenterology

## 2020-08-30 ENCOUNTER — Encounter: Payer: Self-pay | Admitting: Gastroenterology

## 2020-08-30 VITALS — BP 114/81 | HR 63 | Temp 97.1°F | Resp 11 | Ht 68.0 in | Wt 200.0 lb

## 2020-08-30 DIAGNOSIS — K625 Hemorrhage of anus and rectum: Secondary | ICD-10-CM

## 2020-08-30 DIAGNOSIS — K5730 Diverticulosis of large intestine without perforation or abscess without bleeding: Secondary | ICD-10-CM

## 2020-08-30 DIAGNOSIS — R194 Change in bowel habit: Secondary | ICD-10-CM | POA: Diagnosis not present

## 2020-08-30 DIAGNOSIS — D123 Benign neoplasm of transverse colon: Secondary | ICD-10-CM

## 2020-08-30 MED ORDER — SODIUM CHLORIDE 0.9 % IV SOLN: 500.0000 mL | Freq: Once | INTRAVENOUS | Status: DC

## 2020-08-30 MED ORDER — HYDROCORTISONE (PERIANAL) 2.5 % EX CREA: 1.0000 "application " | TOPICAL_CREAM | Freq: Two times a day (BID) | CUTANEOUS | 1 refills | Status: DC

## 2020-08-30 NOTE — Progress Notes (Signed)
Report given to PACU, vss 

## 2020-08-30 NOTE — Progress Notes (Signed)
Called to room to assist during endoscopic procedure.  Patient ID and intended procedure confirmed with present staff. Received instructions for my participation in the procedure from the performing physician.  

## 2020-08-30 NOTE — Patient Instructions (Signed)
Handouts given for polyps, diverticulosis and hemorrhoids.  Await pathology results.  Pick up your new prescription for Anusol 2.5% cream and use twice a day.  YOU HAD AN ENDOSCOPIC PROCEDURE TODAY AT [facility]:   Refer to the procedure report that was given to you for any specific questions about what was found during the examination.  If the procedure report does not answer your questions, please call your gastroenterologist to clarify.  If you requested that your care partner not be given the details of your procedure findings, then the procedure report has been included in a sealed envelope for you to review at your convenience later.  YOU SHOULD EXPECT: Some feelings of bloating in the abdomen. Passage of more gas than usual.  Walking can help get rid of the air that was put into your GI tract during the procedure and reduce the bloating. If you had a lower endoscopy (such as a colonoscopy or flexible sigmoidoscopy) you may notice spotting of blood in your stool or on the toilet paper. If you underwent a bowel prep for your procedure, you may not have a normal bowel movement for a few days.  Please Note:  You might notice some irritation and congestion in your nose or some drainage.  This is from the oxygen used during your procedure.  There is no need for concern and it should clear up in a day or so.  SYMPTOMS TO REPORT IMMEDIATELY:   Following lower endoscopy (colonoscopy or flexible sigmoidoscopy):  Excessive amounts of blood in the stool  Significant tenderness or worsening of abdominal pains  Swelling of the abdomen that is new, acute  Fever of 100F or higher For urgent or emergent issues, a gastroenterologist can be reached at any hour by calling [phone].  Do not use MyChart messaging for urgent concerns.    DIET:  We do recommend a small meal at first, but then you may proceed to your regular diet.  Drink plenty of fluids but you should avoid alcoholic  beverages for 24 hours.  ACTIVITY:  You should plan to take it easy for the rest of today and you should NOT DRIVE or use heavy machinery until tomorrow (because of the sedation medicines used during the test).    FOLLOW UP: Our staff will call the number listed on your records 48-72 hours following your procedure to check on you and address any questions or concerns that you may have regarding the information given to you following your procedure. If we do not reach you, we will leave a message.  We will attempt to reach you two times.  During this call, we will ask if you have developed any symptoms of COVID 19. If you develop any symptoms (ie: fever, flu-like symptoms, shortness of breath, cough etc.) before then, please call [phone].  If you test positive for Covid 19 in the 2 weeks post procedure, please call and report this information to [place].    If any biopsies were taken you will be contacted by phone or by letter within the next 1-3 weeks.  Please call us at [phone] if you have not heard about the biopsies in 3 weeks.    SIGNATURES/CONFIDENTIALITY: You and/or your care partner have signed paperwork which will be entered into your electronic medical record.  These signatures attest to the fact that that the information above on your After Visit Summary has been reviewed and is understood.  Full responsibility of the confidentiality of this  discharge information lies with you and/or your care-partner. 

## 2020-08-30 NOTE — Op Note (Signed)
[place] Patient Name: Brandon Murray Procedure Date: 08/30/2020 1:22 PM MRN: [id number] Endoscopist: Thornton Park MD, MD Age: 40 Referring MD:  Date of Birth: 1981-06-30 Gender: Male Account #: [id number] Procedure:                Colonoscopy Indications:              Rectal bleeding, Change in bowel habits Medicines:                Monitored Anesthesia Care Procedure:                Pre-Anesthesia Assessment:                           - Prior to the procedure, a History and Physical                            was performed, and patient medications and                            allergies were reviewed. The patient's tolerance of                            previous anesthesia was also reviewed. The risks                            and benefits of the procedure and the sedation                            options and risks were discussed with the patient.                            All questions were answered, and informed consent                            was obtained. Prior Anticoagulants: The patient has                            taken no previous anticoagulant or antiplatelet                            agents. ASA Grade Assessment: II - A patient with                            mild systemic disease. After reviewing the risks                            and benefits, the patient was deemed in                            satisfactory condition to undergo the procedure.                           After obtaining informed consent, the colonoscope  was passed under direct vision. Throughout the                            procedure, the patient's blood pressure, pulse, and                            oxygen saturations were monitored continuously. The                            Olympus CF-HQ190L [id number]) Colonoscope was                            introduced through the anus and advanced to the 3                            cm into the ileum. The  colonoscopy was performed                            without difficulty. The patient tolerated the                            procedure well. The quality of the bowel                            preparation was excellent. The terminal ileum,                            ileocecal valve, appendiceal orifice, and rectum                            were photographed. Scope In: 1:44:37 PM Scope Out: 1:59:00 PM Scope Withdrawal Time: 0 hours 12 minutes 2 seconds  Total Procedure Duration: 0 hours 14 minutes 23 seconds  Findings:                 Non-bleeding external and internal hemorrhoids were                            found.                           A few small-mouthed diverticula were found in the                            sigmoid colon.                           A 2 mm polyp was found in the hepatic flexure. The                            polyp was sessile. The polyp was removed with a                            cold snare. Resection and retrieval were complete.  Estimated blood loss was minimal.                           The colon (entire examined portion) appeared                            normal. Biopsies were taken with a cold forceps for                            histology to evaluate for microscopic colitis.                            Estimated blood loss was minimal.                           The exam was otherwise without abnormality on                            direct and retroflexion views. Complications:            No immediate complications. Estimated blood loss:                            Minimal. Estimated Blood Loss:     Estimated blood loss was minimal. Impression:               - Non-bleeding external and internal hemorrhoids.                           - Diverticulosis in the sigmoid colon.                           - One 2 mm polyp at the hepatic flexure, removed                            with a cold snare. Resected and retrieved.                            - The entire examined colon is normal. Biopsied.                           - The examination was otherwise normal on direct                            and retroflexion views. Recommendation:           - Patient has a contact number available for                            emergencies. The signs and symptoms of potential                            delayed complications were discussed with the                            patient. Return to normal activities  tomorrow.                            Written discharge instructions were provided to the                            patient.                           - Resume previous diet. High fiber diet recommended.                           - Continue present medications.                           - Await pathology results.                           - Start using Anusol HC 2.5% applied sparingly to                            your rectum twice daily.                           - Repeat colonoscopy date to be determined after                            pending pathology results are reviewed for                            surveillance.                           - Emerging evidence supports eating a diet of                            fruits, vegetables, grains, calcium, and yogurt                            while reducing red meat and alcohol may reduce the                            risk of colon cancer.                           - Thank you for allowing me to be involved in your                            colon cancer prevention. Thornton Park MD, MD 08/30/2020 2:07:03 PM This report has been signed electronically.

## 2020-08-30 NOTE — Progress Notes (Signed)
VS taken by S.H. 

## 2020-09-03 ENCOUNTER — Telehealth: Payer: Self-pay

## 2020-09-03 NOTE — Telephone Encounter (Signed)
  Follow up Call-  Call back number 08/30/2020  Post procedure Call Back phone  # [phone]  Permission to leave phone message Yes     Patient questions:  Do you have a fever, pain , or abdominal swelling? No. Pain Score  0 *  Have you tolerated food without any problems? Yes.    Have you been able to return to your normal activities? Yes.    Do you have any questions about your discharge instructions: Diet   No. Medications  No. Follow up visit  No.  Do you have questions or concerns about your Care? No.  Actions: * If pain score is 4 or above: No action needed, pain <4.  1. Have you developed a fever since your procedure? no  2.   Have you had an respiratory symptoms (SOB or cough) since your procedure? no  3.   Have you tested positive for COVID 19 since your procedure no  4.   Have you had any family members/close contacts diagnosed with the COVID 19 since your procedure?  no   If yes to any of these questions please route to Joylene John, RN and Joella Prince, RN

## 2020-09-10 ENCOUNTER — Encounter: Payer: Self-pay | Admitting: Gastroenterology

## 2020-10-24 ENCOUNTER — Other Ambulatory Visit: Payer: Self-pay | Admitting: Family Medicine

## 2020-10-24 ENCOUNTER — Other Ambulatory Visit: Payer: Self-pay | Admitting: Nurse Practitioner

## 2020-10-24 ENCOUNTER — Other Ambulatory Visit: Payer: Self-pay

## 2020-10-24 DIAGNOSIS — L649 Androgenic alopecia, unspecified: Secondary | ICD-10-CM

## 2020-10-24 DIAGNOSIS — F419 Anxiety disorder, unspecified: Secondary | ICD-10-CM

## 2020-10-24 DIAGNOSIS — F32A Depression, unspecified: Secondary | ICD-10-CM

## 2020-10-25 ENCOUNTER — Other Ambulatory Visit: Payer: Self-pay

## 2020-10-25 MED ORDER — FINASTERIDE 1 MG PO TABS
ORAL_TABLET | Freq: Every day | ORAL | 0 refills | Status: AC
  Filled 2020-10-25: qty 90, 90d supply, fill #0

## 2020-10-26 ENCOUNTER — Other Ambulatory Visit: Payer: Self-pay | Admitting: Family Medicine

## 2020-10-26 ENCOUNTER — Other Ambulatory Visit: Payer: Self-pay

## 2020-10-26 DIAGNOSIS — F32A Depression, unspecified: Secondary | ICD-10-CM

## 2020-10-26 DIAGNOSIS — F419 Anxiety disorder, unspecified: Secondary | ICD-10-CM

## 2020-10-26 MED ORDER — CITALOPRAM HYDROBROMIDE 10 MG PO TABS
ORAL_TABLET | Freq: Every day | ORAL | 0 refills | Status: AC
  Filled 2020-10-26: qty 90, 90d supply, fill #0

## 2020-10-26 NOTE — Telephone Encounter (Signed)
I sent 1 refill in for the patient.  He needs to establish with a new PCP for further refills.  Please inform him of this.  Thanks.

## 2021-01-09 ENCOUNTER — Telehealth: Payer: Self-pay

## 2021-01-09 DIAGNOSIS — L649 Androgenic alopecia, unspecified: Secondary | ICD-10-CM

## 2021-01-09 DIAGNOSIS — F32A Depression, unspecified: Secondary | ICD-10-CM

## 2021-01-09 NOTE — Telephone Encounter (Signed)
Pt is requesting refill on  finasteride (PROPECIA) 1 MG tablet and citalopram (CELEXA) 10 MG tablet. Pt was told in April to establish care with new PCP. Pt is also requesting lab orders for quantiferon gold tb test. Please advise.

## 2021-01-09 NOTE — Telephone Encounter (Signed)
Patient is leaving current job with [employer] and does not want to establish care with New provider until he relocates to New job Patient requesting refill of citalopram and finasteride until his new insurance takes effect in 90 days. Patient was advised in April he needed to establish with new provider but did not . I have pended medication if can be refilled for 90 days. The quantiferon Gold test patient iwll have new employer handle.

## 2021-01-11 ENCOUNTER — Encounter: Payer: Self-pay | Admitting: *Deleted

## 2021-02-14 DIAGNOSIS — F411 Generalized anxiety disorder: Secondary | ICD-10-CM | POA: Diagnosis not present

## 2021-02-14 DIAGNOSIS — L659 Nonscarring hair loss, unspecified: Secondary | ICD-10-CM | POA: Diagnosis not present

## 2021-04-03 NOTE — Progress Notes (Deleted)
   There were no vitals taken for this visit.   Subjective:    Patient ID: THANE NICODEMUS, male    DOB: 1981/04/19, 40 y.o.   MRN: [id number]  HPI: SHAINA BEAUDET is a 40 y.o. male  No chief complaint on file.  Patient presents to clinic to establish care with new PCP.  Patient reports a history of ***. Patient denies a history of: Hypertension, Elevated Cholesterol, Diabetes, Thyroid problems, Depression, Anxiety, Neurological problems, and Abdominal problems.   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/2020   Resolved Ambulatory Problems    Diagnosis Date Noted   Stress and adjustment reaction 03/17/2018   Acute appendicitis 12/05/2019   Past Medical History:  Diagnosis Date   Depression    Gallstones    History of fractured pelvis 2010   Past Surgical History:  Procedure Laterality Date   APPENDECTOMY     CHOLECYSTECTOMY  2010   LAPAROSCOPIC APPENDECTOMY N/A 12/05/2019   Procedure: APPENDECTOMY LAPAROSCOPIC;  Surgeon: Jesusita Oka, MD;  Location: [facility] OR;  Service: General;  Laterality: N/A;   REFRACTIVE SURGERY Bilateral    Family History  Problem Relation Age of Onset   Diabetes Father    Heart disease Father    Hyperlipidemia Father    Diabetes Maternal Grandmother    Cancer Maternal Grandfather    Heart disease Paternal Grandmother    Diabetes Paternal Grandmother    Celiac disease Mother    Colon cancer Maternal Uncle        50's   Esophageal cancer Neg Hx    Stomach cancer Neg Hx    Rectal cancer Neg Hx      Review of Systems  Per HPI unless specifically  indicated above     Objective:    There were no vitals taken for this visit.  Wt Readings from Last 3 Encounters:  08/30/20 200 lb (90.7 kg)  07/26/20 200 lb 8 oz (90.9 kg)  06/19/20 202 lb 12.8 oz (92 kg)    Physical Exam  Results for orders placed or performed in visit on 06/25/20  Protein, urine, 24 hour  Result Value Ref Range   Urine Total Volume-UPROT 1,900 mL   Collection Interval-UPROT 24 hours   Protein, Urine <6 mg/dL   Protein, 24H Urine        50 - 100 mg/day      Assessment & Plan:   Problem List Items Addressed This Visit       Other   Depression, major, single episode, moderate ([facility])   HLD (hyperlipidemia)   Low vitamin D level   Other Visit Diagnoses     Encounter to establish care    -  Primary        Follow up plan: No follow-ups on file.

## 2021-04-04 ENCOUNTER — Ambulatory Visit: Payer: 59 | Admitting: Nurse Practitioner

## 2021-04-04 DIAGNOSIS — E782 Mixed hyperlipidemia: Secondary | ICD-10-CM

## 2021-04-04 DIAGNOSIS — R7989 Other specified abnormal findings of blood chemistry: Secondary | ICD-10-CM

## 2021-04-04 DIAGNOSIS — F321 Major depressive disorder, single episode, moderate: Secondary | ICD-10-CM

## 2021-04-04 DIAGNOSIS — Z7689 Persons encountering health services in other specified circumstances: Secondary | ICD-10-CM

## 2021-07-30 ENCOUNTER — Other Ambulatory Visit (HOSPITAL_COMMUNITY): Payer: Self-pay

## 2021-11-30 IMAGING — CT CT ABD-PELV W/ CM
2 of 4 series · 17 of 46 positions shown, 19 images · IV contrast (APPLIED)
Comparison: None.

CLINICAL DATA: Right lower quadrant pain beginning yesterday.
Clinical suspicion for appendicitis.

EXAM:
CT ABDOMEN AND PELVIS WITH CONTRAST
TECHNIQUE: Multidetector CT imaging of the abdomen and pelvis was performed
using the standard protocol following bolus administration of
intravenous contrast.
CONTRAST:  100mL OMNIPAQUE IOHEXOL 300 MG/ML  SOLN

[Series 3: abd/ pelvis 5.0 i30f 2 · axial · 0.81mm/px · z∈[+874,+1314]mm · 14 of 96 slices shown, 16 images]
[im 4/96  soft-tissue]
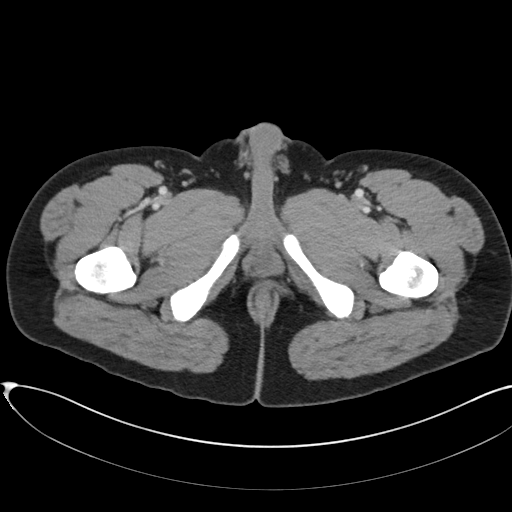
[im 4/96  bone]
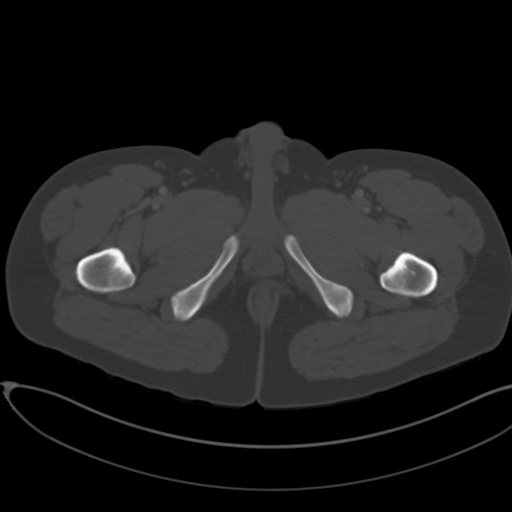
[im 12/96  soft-tissue]
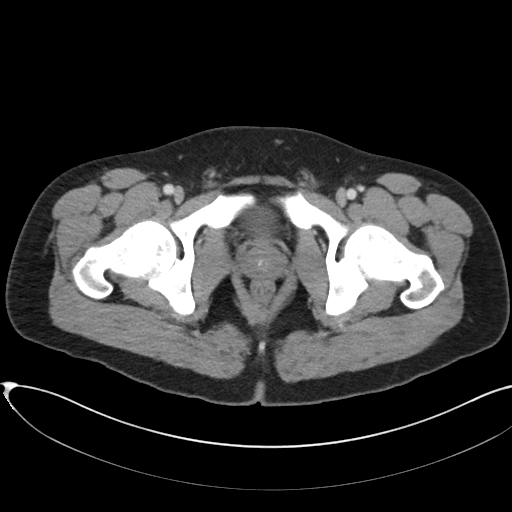
[im 20/96  soft-tissue]
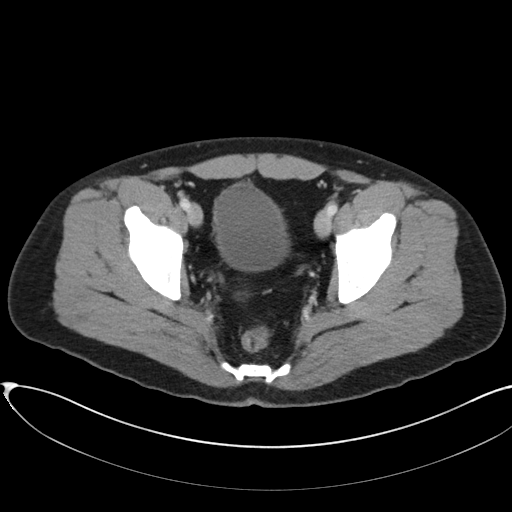
[im 27/96  soft-tissue]
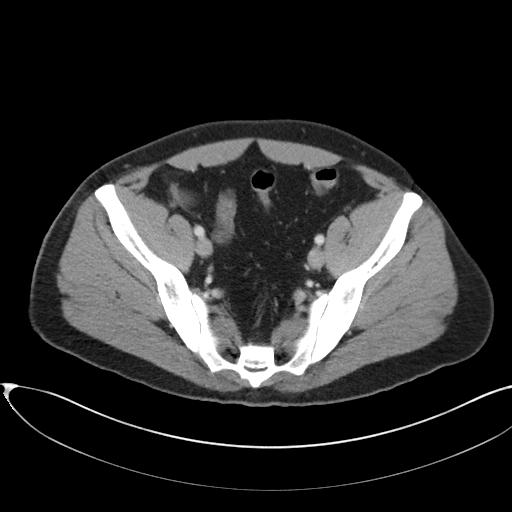
[im 31/96  soft-tissue]
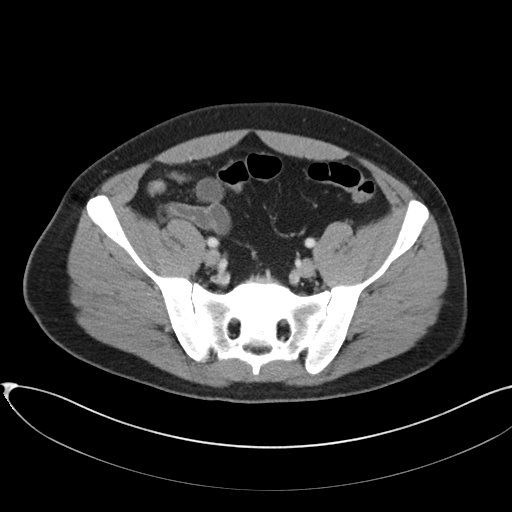
[im 39/96  soft-tissue]
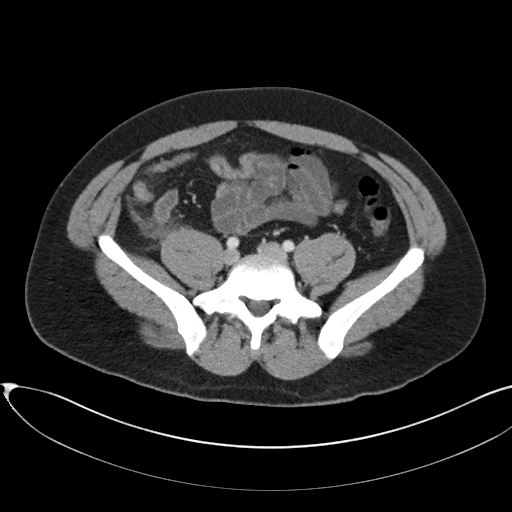
[im 46/96  soft-tissue]
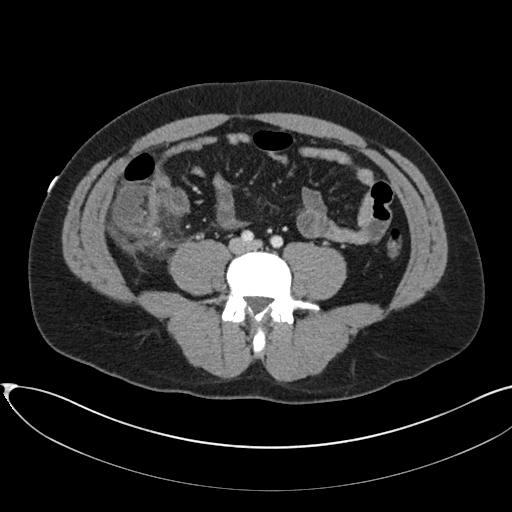
[im 50/96  soft-tissue]
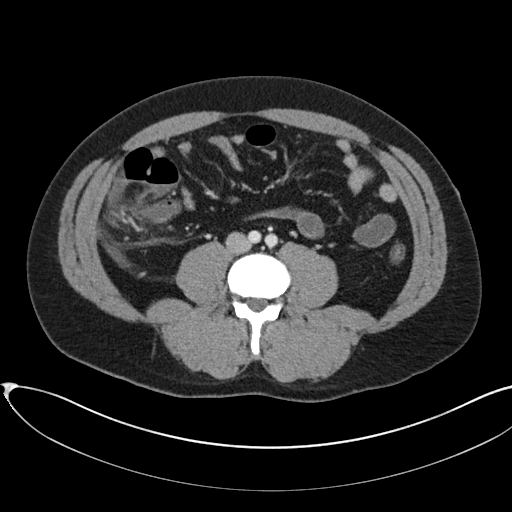
[im 58/96  soft-tissue]
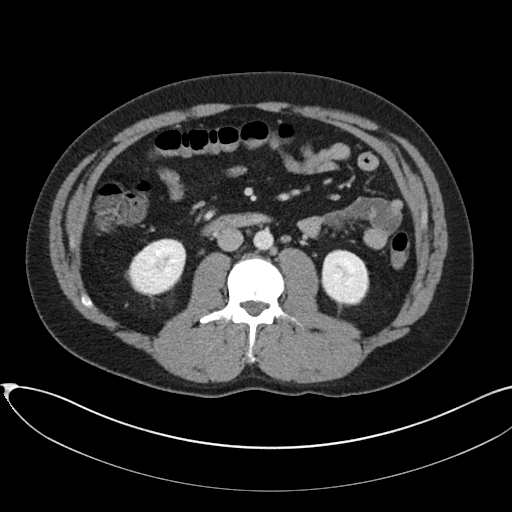
[im 58/96  bone]
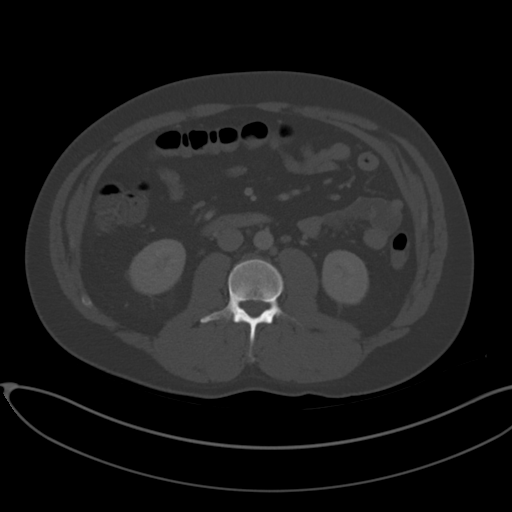
[im 65/96  soft-tissue]
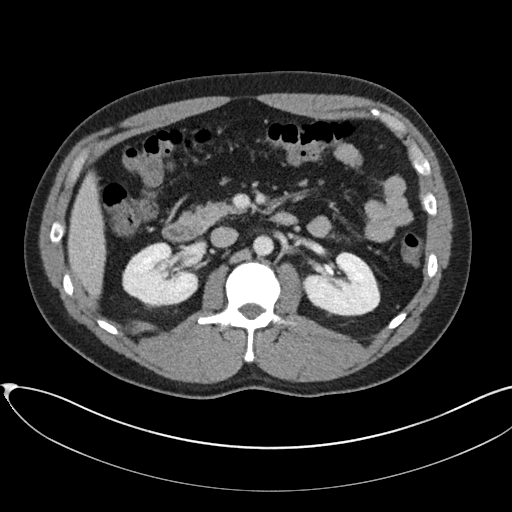
[im 73/96  soft-tissue]
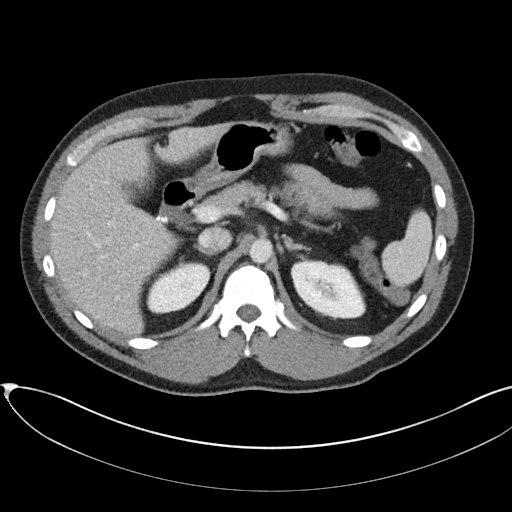
[im 77/96  soft-tissue]
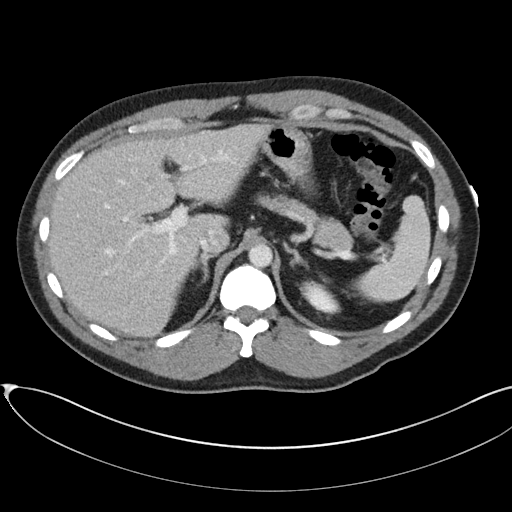
[im 84/96  soft-tissue]
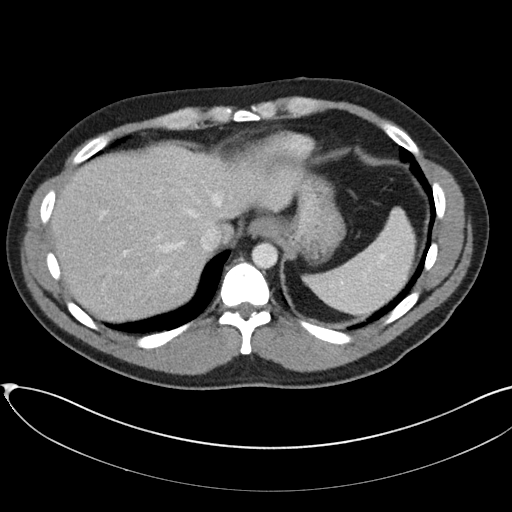
[im 92/96  soft-tissue]
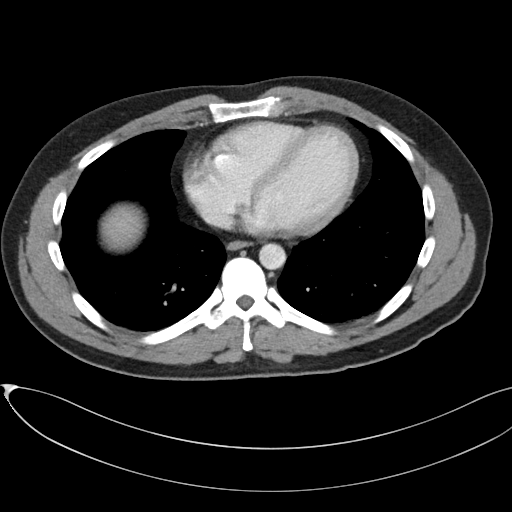

[Series 6: coronal soft tissue · coronal · 0.84mm/px · 3 of 79 slices shown]
[im 27/79  soft-tissue]
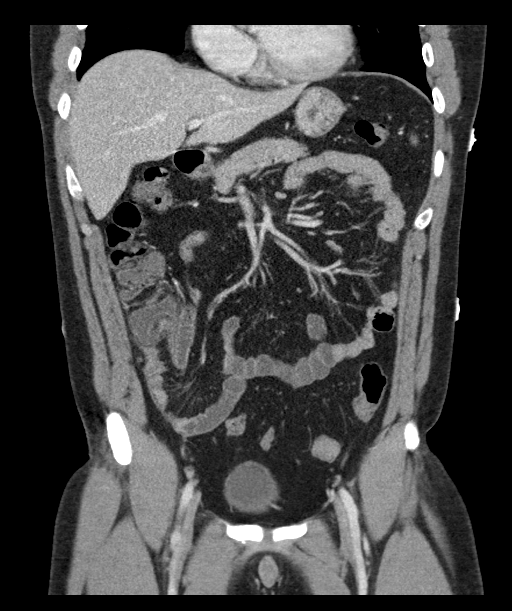
[im 35/79  soft-tissue]
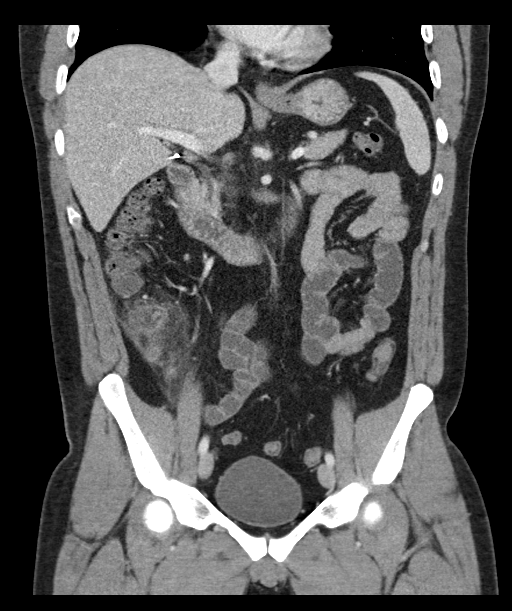
[im 44/79  soft-tissue]
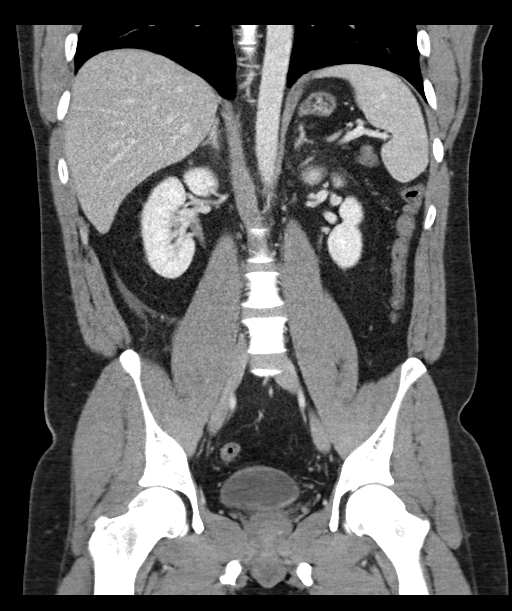

[17 of 46 positions shown; findings below may reference images not displayed]

FINDINGS: Lower Chest: No acute findings.

Hepatobiliary: No hepatic masses identified. Prior cholecystectomy.
No evidence of biliary obstruction.

Pancreas:  No mass or inflammatory changes.

Spleen: Within normal limits in size and appearance.

Adrenals/Urinary Tract: No masses identified. No evidence of
ureteral calculi or hydronephrosis.

Stomach/Bowel: Moderate periappendiceal inflammatory changes are
seen, with specific findings as follows:

Appendix: Location- Standard

4iameter-ES mm

Appendicolith- Present

Mucosal hyper-enhancement- Present

Extraluminal Gas- Absent

Periappendiceal Collection- None

Vascular/Lymphatic: No pathologically enlarged lymph nodes. No
abdominal aortic aneurysm.

Reproductive:  No mass or other significant abnormality.

Other:  None.

Musculoskeletal:  No suspicious bone lesions identified.
IMPRESSION: Positive for acute appendicitis. No evidence of abscess or other
complication.

## 2023-07-02 ENCOUNTER — Other Ambulatory Visit: Payer: Self-pay | Admitting: Medical Genetics

## 2023-07-03 ENCOUNTER — Other Ambulatory Visit
Admission: RE | Admit: 2023-07-03 | Discharge: 2023-07-03 | Disposition: A | Discharge status: Home / Self Care | Payer: Self-pay | Source: Ambulatory Visit | Attending: Oncology | Admitting: Oncology

## 2023-07-20 LAB — GENECONNECT MOLECULAR SCREEN: Genetic Analysis Overall Interpretation: NEGATIVE

## 2023-09-11 ENCOUNTER — Encounter: Payer: Self-pay | Admitting: Family Medicine

## 2023-09-12 ENCOUNTER — Other Ambulatory Visit: Payer: Self-pay | Admitting: Family Medicine

## 2023-09-12 DIAGNOSIS — E7849 Other hyperlipidemia: Secondary | ICD-10-CM

## 2023-10-16 ENCOUNTER — Ambulatory Visit
Admission: RE | Admit: 2023-10-16 | Discharge: 2023-10-16 | Disposition: A | Discharge status: Home / Self Care | Source: Ambulatory Visit | Attending: Family Medicine | Admitting: Family Medicine

## 2023-10-16 DIAGNOSIS — E7849 Other hyperlipidemia: Secondary | ICD-10-CM

## 2024-01-26 ENCOUNTER — Other Ambulatory Visit (HOSPITAL_BASED_OUTPATIENT_CLINIC_OR_DEPARTMENT_OTHER): Payer: Self-pay

## 2024-01-26 MED ORDER — AMPHETAMINE-DEXTROAMPHET ER 15 MG PO CP24
15.0000 mg | ORAL_CAPSULE | Freq: Every day | ORAL | 0 refills | Status: DC
  Filled 2024-01-26: qty 30, 30d supply, fill #0

## 2024-02-29 ENCOUNTER — Other Ambulatory Visit (HOSPITAL_BASED_OUTPATIENT_CLINIC_OR_DEPARTMENT_OTHER): Payer: Self-pay

## 2024-02-29 ENCOUNTER — Other Ambulatory Visit: Payer: Self-pay

## 2024-02-29 MED ORDER — AMPHETAMINE-DEXTROAMPHET ER 15 MG PO CP24
15.0000 mg | ORAL_CAPSULE | Freq: Every day | ORAL | 0 refills | Status: DC
  Filled 2024-02-29: qty 30, 30d supply, fill #0

## 2024-02-29 MED ORDER — CITALOPRAM HYDROBROMIDE 10 MG PO TABS
10.0000 mg | ORAL_TABLET | Freq: Every day | ORAL | 3 refills | Status: AC
  Filled 2024-02-29: qty 90, 90d supply, fill #0

## 2024-03-14 ENCOUNTER — Other Ambulatory Visit (HOSPITAL_BASED_OUTPATIENT_CLINIC_OR_DEPARTMENT_OTHER): Payer: Self-pay

## 2024-03-14 MED ORDER — AMPHETAMINE-DEXTROAMPHETAMINE 5 MG PO TABS
5.0000 mg | ORAL_TABLET | Freq: Every day | ORAL | 0 refills | Status: AC
  Filled 2024-03-14: qty 30, 30d supply, fill #0

## 2024-04-08 ENCOUNTER — Other Ambulatory Visit (HOSPITAL_BASED_OUTPATIENT_CLINIC_OR_DEPARTMENT_OTHER): Payer: Self-pay

## 2024-04-08 ENCOUNTER — Other Ambulatory Visit: Payer: Self-pay

## 2024-04-08 MED ORDER — AMPHETAMINE-DEXTROAMPHET ER 15 MG PO CP24
ORAL_CAPSULE | Freq: Every day | ORAL | 0 refills | Status: DC
  Filled 2024-04-08: qty 30, 30d supply, fill #0

## 2024-05-17 ENCOUNTER — Other Ambulatory Visit (HOSPITAL_BASED_OUTPATIENT_CLINIC_OR_DEPARTMENT_OTHER): Payer: Self-pay

## 2024-05-17 MED ORDER — AMPHETAMINE-DEXTROAMPHETAMINE 5 MG PO TABS
5.0000 mg | ORAL_TABLET | Freq: Every day | ORAL | 0 refills | Status: DC | PRN
  Filled 2024-05-17: qty 30, 30d supply, fill #0

## 2024-05-17 MED ORDER — AMPHETAMINE-DEXTROAMPHET ER 15 MG PO CP24
15.0000 mg | ORAL_CAPSULE | Freq: Every day | ORAL | 0 refills | Status: DC
  Filled 2024-05-17: qty 30, 30d supply, fill #0

## 2024-06-20 ENCOUNTER — Other Ambulatory Visit (HOSPITAL_BASED_OUTPATIENT_CLINIC_OR_DEPARTMENT_OTHER): Payer: Self-pay

## 2024-06-20 MED ORDER — AMPHETAMINE-DEXTROAMPHET ER 15 MG PO CP24
15.0000 mg | ORAL_CAPSULE | Freq: Every day | ORAL | 0 refills | Status: DC
  Filled 2024-06-20: qty 30, 30d supply, fill #0

## 2024-06-20 MED ORDER — AMPHETAMINE-DEXTROAMPHETAMINE 5 MG PO TABS
5.0000 mg | ORAL_TABLET | Freq: Every day | ORAL | 0 refills | Status: DC | PRN
  Filled 2024-06-20: qty 30, 30d supply, fill #0

## 2024-07-25 ENCOUNTER — Other Ambulatory Visit (HOSPITAL_BASED_OUTPATIENT_CLINIC_OR_DEPARTMENT_OTHER): Payer: Self-pay

## 2024-07-25 MED ORDER — CITALOPRAM HYDROBROMIDE 10 MG PO TABS
10.0000 mg | ORAL_TABLET | Freq: Every day | ORAL | 3 refills | Status: AC
  Filled 2024-07-25: qty 90, 90d supply, fill #0

## 2024-07-25 MED ORDER — AMPHETAMINE-DEXTROAMPHETAMINE 5 MG PO TABS
5.0000 mg | ORAL_TABLET | Freq: Every day | ORAL | 0 refills | Status: AC | PRN
  Filled 2024-07-25: qty 30, 30d supply, fill #0

## 2024-07-25 MED ORDER — AMPHETAMINE-DEXTROAMPHET ER 15 MG PO CP24
15.0000 mg | ORAL_CAPSULE | Freq: Every day | ORAL | 0 refills | Status: AC
  Filled 2024-07-25: qty 30, 30d supply, fill #0
